# Patient Record
Sex: Male | Born: 1935 | Race: Black or African American | Hispanic: No | Marital: Married | Smoking: Never smoker
Health system: Southern US, Community
[De-identification: ages and names within clinical notes are randomized; demographics above are authoritative.]

## PROBLEM LIST (undated history)

## (undated) DIAGNOSIS — I700 Atherosclerosis of aorta: Secondary | ICD-10-CM

## (undated) DIAGNOSIS — I10 Essential (primary) hypertension: Secondary | ICD-10-CM

## (undated) DIAGNOSIS — Z9842 Cataract extraction status, left eye: Secondary | ICD-10-CM

## (undated) DIAGNOSIS — Z9841 Cataract extraction status, right eye: Secondary | ICD-10-CM

## (undated) DIAGNOSIS — I429 Cardiomyopathy, unspecified: Secondary | ICD-10-CM

## (undated) DIAGNOSIS — J45909 Unspecified asthma, uncomplicated: Secondary | ICD-10-CM

## (undated) DIAGNOSIS — J449 Chronic obstructive pulmonary disease, unspecified: Secondary | ICD-10-CM

## (undated) DIAGNOSIS — I5022 Chronic systolic (congestive) heart failure: Secondary | ICD-10-CM

## (undated) DIAGNOSIS — E782 Mixed hyperlipidemia: Secondary | ICD-10-CM

## (undated) DIAGNOSIS — Z860101 Personal history of adenomatous and serrated colon polyps: Secondary | ICD-10-CM

## (undated) DIAGNOSIS — I442 Atrioventricular block, complete: Secondary | ICD-10-CM

## (undated) DIAGNOSIS — Z950 Presence of cardiac pacemaker: Secondary | ICD-10-CM

## (undated) DIAGNOSIS — R972 Elevated prostate specific antigen [PSA]: Secondary | ICD-10-CM

## (undated) DIAGNOSIS — I509 Heart failure, unspecified: Secondary | ICD-10-CM

## (undated) DIAGNOSIS — I428 Other cardiomyopathies: Secondary | ICD-10-CM

## (undated) DIAGNOSIS — J471 Bronchiectasis with (acute) exacerbation: Secondary | ICD-10-CM

## (undated) DIAGNOSIS — M1990 Unspecified osteoarthritis, unspecified site: Secondary | ICD-10-CM

## (undated) DIAGNOSIS — J850 Gangrene and necrosis of lung: Secondary | ICD-10-CM

## (undated) DIAGNOSIS — Z9109 Other allergy status, other than to drugs and biological substances: Secondary | ICD-10-CM

## (undated) DIAGNOSIS — Z86010 Personal history of colonic polyps: Secondary | ICD-10-CM

## (undated) DIAGNOSIS — D649 Anemia, unspecified: Secondary | ICD-10-CM

## (undated) HISTORY — PX: COLONOSCOPY: SHX174

---

## 2005-09-22 ENCOUNTER — Ambulatory Visit: Payer: Self-pay | Admitting: Internal Medicine

## 2005-12-19 HISTORY — PX: COLONOSCOPY: SHX174

## 2006-04-11 ENCOUNTER — Ambulatory Visit: Payer: Self-pay | Admitting: Unknown Physician Specialty

## 2007-12-20 DIAGNOSIS — Z950 Presence of cardiac pacemaker: Secondary | ICD-10-CM

## 2007-12-20 HISTORY — DX: Presence of cardiac pacemaker: Z95.0

## 2008-09-26 ENCOUNTER — Ambulatory Visit: Payer: Self-pay | Admitting: Cardiology

## 2008-09-27 ENCOUNTER — Other Ambulatory Visit: Payer: Self-pay

## 2008-09-29 HISTORY — PX: PACEMAKER INSERTION: SHX728

## 2008-09-29 HISTORY — PX: INSERT / REPLACE / REMOVE PACEMAKER: SUR710

## 2009-06-15 ENCOUNTER — Ambulatory Visit: Payer: Self-pay | Admitting: Internal Medicine

## 2010-12-19 HISTORY — PX: COLONOSCOPY: SHX174

## 2011-07-06 ENCOUNTER — Ambulatory Visit: Payer: Self-pay | Admitting: Unknown Physician Specialty

## 2012-03-04 ENCOUNTER — Emergency Department: Payer: Self-pay | Admitting: Internal Medicine

## 2012-03-04 LAB — CBC
MCH: 27.9 pg (ref 26.0–34.0)
MCV: 86 fL (ref 80–100)
Platelet: 231 10*3/uL (ref 150–440)
RDW: 15.1 % — ABNORMAL HIGH (ref 11.5–14.5)

## 2012-03-04 LAB — COMPREHENSIVE METABOLIC PANEL
Anion Gap: 11 (ref 7–16)
Bilirubin,Total: 0.6 mg/dL (ref 0.2–1.0)
Calcium, Total: 8.8 mg/dL (ref 8.5–10.1)
Chloride: 102 mmol/L (ref 98–107)
Co2: 26 mmol/L (ref 21–32)
Creatinine: 1.49 mg/dL — ABNORMAL HIGH (ref 0.60–1.30)
EGFR (African American): 60 — ABNORMAL LOW
EGFR (Non-African Amer.): 49 — ABNORMAL LOW
Osmolality: 280 (ref 275–301)
Potassium: 4 mmol/L (ref 3.5–5.1)
SGOT(AST): 26 U/L (ref 15–37)

## 2012-03-04 LAB — CK TOTAL AND CKMB (NOT AT ARMC): CK-MB: <0.5 ng/mL — ABNORMAL LOW (ref 0.5–3.6)

## 2012-03-04 LAB — TROPONIN I: Troponin-I: <0.02 ng/mL

## 2012-06-01 ENCOUNTER — Emergency Department: Payer: Self-pay | Admitting: Emergency Medicine

## 2014-04-29 ENCOUNTER — Ambulatory Visit: Payer: Self-pay | Admitting: Internal Medicine

## 2014-10-21 DIAGNOSIS — I5022 Chronic systolic (congestive) heart failure: Secondary | ICD-10-CM | POA: Insufficient documentation

## 2015-10-05 DIAGNOSIS — I10 Essential (primary) hypertension: Secondary | ICD-10-CM | POA: Insufficient documentation

## 2015-10-05 DIAGNOSIS — Z950 Presence of cardiac pacemaker: Secondary | ICD-10-CM | POA: Insufficient documentation

## 2015-12-03 ENCOUNTER — Encounter: Payer: Self-pay | Admitting: *Deleted

## 2015-12-08 NOTE — Discharge Instructions (Signed)

## 2015-12-09 ENCOUNTER — Encounter
Admission: RE | Disposition: A | Discharge status: Home / Self Care | Payer: Self-pay | Source: Ambulatory Visit | Attending: Ophthalmology

## 2015-12-09 ENCOUNTER — Ambulatory Visit: Payer: Medicare Other | Admitting: Anesthesiology

## 2015-12-09 ENCOUNTER — Ambulatory Visit
Admission: RE | Admit: 2015-12-09 | Discharge: 2015-12-09 | Disposition: A | Discharge status: Home / Self Care | Payer: Medicare Other | Source: Ambulatory Visit | Attending: Ophthalmology | Admitting: Ophthalmology

## 2015-12-09 DIAGNOSIS — J439 Emphysema, unspecified: Secondary | ICD-10-CM | POA: Diagnosis not present

## 2015-12-09 DIAGNOSIS — Z950 Presence of cardiac pacemaker: Secondary | ICD-10-CM | POA: Diagnosis not present

## 2015-12-09 DIAGNOSIS — I509 Heart failure, unspecified: Secondary | ICD-10-CM | POA: Insufficient documentation

## 2015-12-09 DIAGNOSIS — I10 Essential (primary) hypertension: Secondary | ICD-10-CM | POA: Insufficient documentation

## 2015-12-09 DIAGNOSIS — H2511 Age-related nuclear cataract, right eye: Secondary | ICD-10-CM | POA: Insufficient documentation

## 2015-12-09 DIAGNOSIS — M1990 Unspecified osteoarthritis, unspecified site: Secondary | ICD-10-CM | POA: Diagnosis not present

## 2015-12-09 HISTORY — DX: Essential (primary) hypertension: I10

## 2015-12-09 HISTORY — DX: Unspecified asthma, uncomplicated: J45.909

## 2015-12-09 HISTORY — DX: Chronic obstructive pulmonary disease, unspecified: J44.9

## 2015-12-09 HISTORY — DX: Presence of cardiac pacemaker: Z95.0

## 2015-12-09 HISTORY — DX: Unspecified osteoarthritis, unspecified site: M19.90

## 2015-12-09 HISTORY — DX: Cardiomyopathy, unspecified: I42.9

## 2015-12-09 HISTORY — DX: Heart failure, unspecified: I50.9

## 2015-12-09 HISTORY — DX: Other allergy status, other than to drugs and biological substances: Z91.09

## 2015-12-09 HISTORY — PX: CATARACT EXTRACTION W/PHACO: SHX586

## 2015-12-09 SURGERY — PHACOEMULSIFICATION, CATARACT, WITH IOL INSERTION
Anesthesia: Monitor Anesthesia Care | Laterality: Right | Wound class: Clean

## 2015-12-09 MED ORDER — ACETAMINOPHEN 160 MG/5ML PO SOLN: 325.0000 mg | ORAL | Status: DC | PRN

## 2015-12-09 MED ORDER — NA HYALUR & NA CHOND-NA HYALUR 0.4-0.35 ML IO KIT
PACK | INTRAOCULAR | Status: DC | PRN
  Administered 2015-12-09: 1 mL via INTRAOCULAR

## 2015-12-09 MED ORDER — BRIMONIDINE TARTRATE 0.2 % OP SOLN
OPHTHALMIC | Status: DC | PRN
  Administered 2015-12-09: 1 [drp] via OPHTHALMIC

## 2015-12-09 MED ORDER — TIMOLOL MALEATE 0.5 % OP SOLN
OPHTHALMIC | Status: DC | PRN
  Administered 2015-12-09: 1 [drp] via OPHTHALMIC

## 2015-12-09 MED ORDER — ACETAMINOPHEN 325 MG PO TABS: 325.0000 mg | ORAL_TABLET | ORAL | Status: DC | PRN

## 2015-12-09 MED ORDER — CEFUROXIME OPHTHALMIC INJECTION 1 MG/0.1 ML
INJECTION | OPHTHALMIC | Status: DC | PRN
  Administered 2015-12-09: 0.1 mL via INTRACAMERAL

## 2015-12-09 MED ORDER — POVIDONE-IODINE 5 % OP SOLN
1.0000 "application " | OPHTHALMIC | Status: DC | PRN
  Administered 2015-12-09: 1 via OPHTHALMIC

## 2015-12-09 MED ORDER — TETRACAINE HCL 0.5 % OP SOLN
1.0000 [drp] | OPHTHALMIC | Status: DC | PRN
  Administered 2015-12-09: 1 [drp] via OPHTHALMIC

## 2015-12-09 MED ORDER — OXYCODONE HCL 5 MG PO TABS: 5.0000 mg | ORAL_TABLET | Freq: Once | ORAL | Status: DC | PRN

## 2015-12-09 MED ORDER — OXYCODONE HCL 5 MG/5ML PO SOLN: 5.0000 mg | Freq: Once | ORAL | Status: DC | PRN

## 2015-12-09 MED ORDER — DEXAMETHASONE SODIUM PHOSPHATE 4 MG/ML IJ SOLN: 8.0000 mg | Freq: Once | INTRAMUSCULAR | Status: DC | PRN

## 2015-12-09 MED ORDER — LACTATED RINGERS IV SOLN: 500.0000 mL | INTRAVENOUS | Status: DC

## 2015-12-09 MED ORDER — FENTANYL CITRATE (PF) 100 MCG/2ML IJ SOLN
INTRAMUSCULAR | Status: DC | PRN
  Administered 2015-12-09: 50 ug via INTRAVENOUS

## 2015-12-09 MED ORDER — ARMC OPHTHALMIC DILATING GEL
1.0000 "application " | OPHTHALMIC | Status: DC | PRN
  Administered 2015-12-09 (×2): 1 via OPHTHALMIC

## 2015-12-09 MED ORDER — MIDAZOLAM HCL 2 MG/2ML IJ SOLN
INTRAMUSCULAR | Status: DC | PRN
  Administered 2015-12-09: 2 mg via INTRAVENOUS

## 2015-12-09 MED ORDER — EPINEPHRINE HCL 1 MG/ML IJ SOLN
INTRAOCULAR | Status: DC | PRN
  Administered 2015-12-09: 70 mL via OPHTHALMIC

## 2015-12-09 MED ORDER — FENTANYL CITRATE (PF) 100 MCG/2ML IJ SOLN: 25.0000 ug | INTRAMUSCULAR | Status: DC | PRN

## 2015-12-09 MED ORDER — LACTATED RINGERS IV SOLN: INTRAVENOUS | Status: DC

## 2015-12-09 SURGICAL SUPPLY — 28 items
CANNULA ANT/CHMB 27GA (MISCELLANEOUS) ×3 IMPLANT
CARTRIDGE ABBOTT (MISCELLANEOUS) ×3 IMPLANT
GLOVE SURG LX 7.5 STRW (GLOVE) ×2
GLOVE SURG LX STRL 7.5 STRW (GLOVE) ×1 IMPLANT
GLOVE SURG TRIUMPH 8.0 PF LTX (GLOVE) ×3 IMPLANT
GOWN STRL REUS W/ TWL LRG LVL3 (GOWN DISPOSABLE) ×2 IMPLANT
GOWN STRL REUS W/TWL LRG LVL3 (GOWN DISPOSABLE) ×4
LENS IOL TECNIS 21 (Intraocular Lens) ×1 IMPLANT
LENS IOL TECNIS 21.0 (Intraocular Lens) ×2 IMPLANT
LENS IOL TECNIS MONO 1P 21.0 (Intraocular Lens) ×1 IMPLANT
MARKER SKIN SURG W/RULER VIO (MISCELLANEOUS) ×3 IMPLANT
NDL RETROBULBAR .5 NSTRL (NEEDLE) IMPLANT
NEEDLE FILTER BLUNT 18X 1/2SAF (NEEDLE) ×2
NEEDLE FILTER BLUNT 18X1 1/2 (NEEDLE) ×1 IMPLANT
PACK CATARACT BRASINGTON (MISCELLANEOUS) ×3 IMPLANT
PACK EYE AFTER SURG (MISCELLANEOUS) ×3 IMPLANT
PACK OPTHALMIC (MISCELLANEOUS) ×3 IMPLANT
RING MALYGIN 7.0 (MISCELLANEOUS) IMPLANT
SUT ETHILON 10-0 CS-B-6CS-B-6 (SUTURE)
SUT VICRYL  9 0 (SUTURE)
SUT VICRYL 9 0 (SUTURE) IMPLANT
SUTURE EHLN 10-0 CS-B-6CS-B-6 (SUTURE) IMPLANT
SYR 3ML LL SCALE MARK (SYRINGE) ×3 IMPLANT
SYR 5ML LL (SYRINGE) IMPLANT
SYR TB 1ML LUER SLIP (SYRINGE) ×3 IMPLANT
WATER STERILE IRR 250ML POUR (IV SOLUTION) ×3 IMPLANT
WATER STERILE IRR 500ML POUR (IV SOLUTION) IMPLANT
WIPE NON LINTING 3.25X3.25 (MISCELLANEOUS) ×3 IMPLANT

## 2015-12-09 NOTE — Anesthesia Postprocedure Evaluation (Signed)
Anesthesia Post Note  Patient: Patrick Figueroa.  Procedure(s) Performed: Procedure(s) (LRB): CATARACT EXTRACTION PHACO AND INTRAOCULAR LENS PLACEMENT (IOC) (Right)  Patient location during evaluation: PACU Anesthesia Type: MAC Level of consciousness: awake and alert Pain management: pain level controlled Vital Signs Assessment: post-procedure vital signs reviewed and stable Respiratory status: spontaneous breathing, nonlabored ventilation, respiratory function stable and patient connected to nasal cannula oxygen Cardiovascular status: blood pressure returned to baseline and stable Postop Assessment: no signs of nausea or vomiting Anesthetic complications: no    DANIEL D KOVACS

## 2015-12-09 NOTE — Anesthesia Procedure Notes (Signed)
Procedure Name: MAC Performed by: Ndidi Nesby Pre-anesthesia Checklist: Patient identified, Emergency Drugs available, Suction available, Timeout performed and Patient being monitored Patient Re-evaluated:Patient Re-evaluated prior to inductionOxygen Delivery Method: Nasal cannula Placement Confirmation: positive ETCO2     

## 2015-12-09 NOTE — Anesthesia Preprocedure Evaluation (Signed)
Anesthesia Evaluation  Patient identified by MRN, date of birth, ID band Patient awake    Reviewed: Allergy & Precautions, H&P , NPO status , Patient's Chart, lab work & pertinent test results, reviewed documented beta blocker date and time   Airway Mallampati: III  TM Distance: >3 FB Neck ROM: full    Dental no notable dental hx.    Pulmonary asthma , COPD,  COPD inhaler,    Pulmonary exam normal breath sounds clear to auscultation       Cardiovascular Exercise Tolerance: Good hypertension, On Medications +CHF  + pacemaker  Rhythm:regular Rate:Normal     Neuro/Psych negative neurological ROS  negative psych ROS   GI/Hepatic negative GI ROS, Neg liver ROS,   Endo/Other  negative endocrine ROS  Renal/GU negative Renal ROS  negative genitourinary   Musculoskeletal   Abdominal   Peds  Hematology negative hematology ROS (+)   Anesthesia Other Findings   Reproductive/Obstetrics negative OB ROS                             Anesthesia Physical Anesthesia Plan  ASA: IV  Anesthesia Plan: MAC   Post-op Pain Management:    Induction:   Airway Management Planned:   Additional Equipment:   Intra-op Plan:   Post-operative Plan:   Informed Consent: I have reviewed the patients History and Physical, chart, labs and discussed the procedure including the risks, benefits and alternatives for the proposed anesthesia with the patient or authorized representative who has indicated his/her understanding and acceptance.     Plan Discussed with: CRNA  Anesthesia Plan Comments:         Anesthesia Quick Evaluation

## 2015-12-09 NOTE — Transfer of Care (Signed)
Immediate Anesthesia Transfer of Care Note  Patient: Patrick Figueroa.  Procedure(s) Performed: Procedure(s): CATARACT EXTRACTION PHACO AND INTRAOCULAR LENS PLACEMENT (IOC) (Right)  Patient Location: PACU  Anesthesia Type: MAC  Level of Consciousness: awake, alert  and patient cooperative  Airway and Oxygen Therapy: Patient Spontanous Breathing and Patient connected to supplemental oxygen  Post-op Assessment: Post-op Vital signs reviewed, Patient's Cardiovascular Status Stable, Respiratory Function Stable, Patent Airway and No signs of Nausea or vomiting  Post-op Vital Signs: Reviewed and stable  Complications: No apparent anesthesia complications

## 2015-12-09 NOTE — H&P (Signed)
  The History and Physical notes are on paper, have been signed, and are to be scanned. The patient remains stable and unchanged from the H&P.   Previous H&P reviewed, patient examined, and there are no changes.  Elene Downum 12/09/2015 9:51 AM

## 2015-12-09 NOTE — Op Note (Signed)
LOCATION:  [facility]   PREOPERATIVE DIAGNOSIS:    Nuclear sclerotic cataract right eye. H25.11   POSTOPERATIVE DIAGNOSIS:  Nuclear sclerotic cataract right eye.     PROCEDURE:  Phacoemusification with posterior chamber intraocular lens placement of the right eye   LENS:   Implant Name Type Inv. Item Serial No. Manufacturer Lot No. LRB No. Used  LENS IMPL INTRAOC ZCB00 21.0 - [id number] Intraocular Lens LENS IMPL INTRAOC ZCB00 21.0 [id number] AMO   Right 1        ULTRASOUND TIME: 14 % of 1 minutes, 25 seconds.  CDE 12.1   SURGEON:  Deirdre Evener, MD   ANESTHESIA:  Topical with tetracaine drops and 2% Xylocaine jelly.   COMPLICATIONS:  None.   DESCRIPTION OF PROCEDURE:  The patient was identified in the holding room and transported to the operating room and placed in the supine position under the operating microscope.  The right eye was identified as the operative eye and it was prepped and draped in the usual sterile ophthalmic fashion.   A 1 millimeter clear-corneal paracentesis was made at the 12:00 position.  The anterior chamber was filled with Viscoat viscoelastic.  A 2.4 millimeter keratome was used to make a near-clear corneal incision at the 9:00 position.  A curvilinear capsulorrhexis was made with a cystotome and capsulorrhexis forceps.  Balanced salt solution was used to hydrodissect and hydrodelineate the nucleus.   Phacoemulsification was then used in stop and chop fashion to remove the lens nucleus and epinucleus.  The remaining cortex was then removed using the irrigation and aspiration handpiece. Provisc was then placed into the capsular bag to distend it for lens placement.  A lens was then injected into the capsular bag.  The remaining viscoelastic was aspirated.   Wounds were hydrated with balanced salt solution.  The anterior chamber was inflated to a physiologic pressure with balanced salt solution.  No wound leaks were noted. Cefuroxime 0.1 ml of  a 10mg /ml solution was injected into the anterior chamber for a dose of 1 mg of intracameral antibiotic at the completion of the case.   Timolol and Brimonidine drops were applied to the eye.  The patient was taken to the recovery room in stable condition without complications of anesthesia or surgery.   Tykera Skates 12/09/2015, 11:30 AM

## 2015-12-10 ENCOUNTER — Encounter: Payer: Self-pay | Admitting: Ophthalmology

## 2016-01-06 ENCOUNTER — Encounter: Payer: Self-pay | Admitting: *Deleted

## 2016-01-12 NOTE — Discharge Instructions (Signed)

## 2016-01-13 ENCOUNTER — Ambulatory Visit: Payer: Medicare Other | Admitting: Anesthesiology

## 2016-01-13 ENCOUNTER — Encounter: Payer: Self-pay | Admitting: *Deleted

## 2016-01-13 ENCOUNTER — Ambulatory Visit
Admission: RE | Admit: 2016-01-13 | Discharge: 2016-01-13 | Disposition: A | Discharge status: Home / Self Care | Payer: Medicare Other | Source: Ambulatory Visit | Attending: Ophthalmology | Admitting: Ophthalmology

## 2016-01-13 ENCOUNTER — Encounter
Admission: RE | Disposition: A | Discharge status: Home / Self Care | Payer: Self-pay | Source: Ambulatory Visit | Attending: Ophthalmology

## 2016-01-13 DIAGNOSIS — H2512 Age-related nuclear cataract, left eye: Secondary | ICD-10-CM | POA: Insufficient documentation

## 2016-01-13 DIAGNOSIS — I10 Essential (primary) hypertension: Secondary | ICD-10-CM | POA: Diagnosis not present

## 2016-01-13 DIAGNOSIS — J449 Chronic obstructive pulmonary disease, unspecified: Secondary | ICD-10-CM | POA: Insufficient documentation

## 2016-01-13 DIAGNOSIS — I509 Heart failure, unspecified: Secondary | ICD-10-CM | POA: Insufficient documentation

## 2016-01-13 DIAGNOSIS — Z950 Presence of cardiac pacemaker: Secondary | ICD-10-CM | POA: Insufficient documentation

## 2016-01-13 HISTORY — PX: CATARACT EXTRACTION W/PHACO: SHX586

## 2016-01-13 SURGERY — PHACOEMULSIFICATION, CATARACT, WITH IOL INSERTION
Anesthesia: Monitor Anesthesia Care | Laterality: Left | Wound class: Clean

## 2016-01-13 MED ORDER — BRIMONIDINE TARTRATE 0.2 % OP SOLN
OPHTHALMIC | Status: DC | PRN
  Administered 2016-01-13: 1 [drp] via OPHTHALMIC

## 2016-01-13 MED ORDER — FENTANYL CITRATE (PF) 100 MCG/2ML IJ SOLN
INTRAMUSCULAR | Status: DC | PRN
Start: 2016-01-13 — End: 2016-01-13
  Administered 2016-01-13: 50 ug via INTRAVENOUS

## 2016-01-13 MED ORDER — ACETAMINOPHEN 160 MG/5ML PO SOLN: 325.0000 mg | ORAL | Status: DC | PRN

## 2016-01-13 MED ORDER — CEFUROXIME OPHTHALMIC INJECTION 1 MG/0.1 ML
INJECTION | OPHTHALMIC | Status: DC | PRN
  Administered 2016-01-13: 0.1 mL via INTRACAMERAL

## 2016-01-13 MED ORDER — EPINEPHRINE HCL 1 MG/ML IJ SOLN
INTRAMUSCULAR | Status: DC | PRN
  Administered 2016-01-13: 70 mL via OPHTHALMIC

## 2016-01-13 MED ORDER — FENTANYL CITRATE (PF) 100 MCG/2ML IJ SOLN: 25.0000 ug | INTRAMUSCULAR | Status: DC | PRN

## 2016-01-13 MED ORDER — ACETAMINOPHEN 325 MG PO TABS: 325.0000 mg | ORAL_TABLET | ORAL | Status: DC | PRN

## 2016-01-13 MED ORDER — ARMC OPHTHALMIC DILATING GEL
1.0000 "application " | OPHTHALMIC | Status: DC | PRN
  Administered 2016-01-13 (×2): 1 via OPHTHALMIC

## 2016-01-13 MED ORDER — MIDAZOLAM HCL 2 MG/2ML IJ SOLN
INTRAMUSCULAR | Status: DC | PRN
  Administered 2016-01-13: 1 mg via INTRAVENOUS

## 2016-01-13 MED ORDER — OXYCODONE HCL 5 MG PO TABS: 5.0000 mg | ORAL_TABLET | Freq: Once | ORAL | Status: DC | PRN

## 2016-01-13 MED ORDER — TETRACAINE HCL 0.5 % OP SOLN
1.0000 [drp] | OPHTHALMIC | Status: DC | PRN
  Administered 2016-01-13: 1 [drp] via OPHTHALMIC

## 2016-01-13 MED ORDER — TIMOLOL MALEATE 0.5 % OP SOLN
OPHTHALMIC | Status: DC | PRN
Start: 2016-01-13 — End: 2016-01-13
  Administered 2016-01-13: 1 [drp] via OPHTHALMIC

## 2016-01-13 MED ORDER — NA HYALUR & NA CHOND-NA HYALUR 0.4-0.35 ML IO KIT
PACK | INTRAOCULAR | Status: DC | PRN
  Administered 2016-01-13: 1 mL via INTRAOCULAR

## 2016-01-13 MED ORDER — LACTATED RINGERS IV SOLN: 500.0000 mL | INTRAVENOUS | Status: DC

## 2016-01-13 MED ORDER — POVIDONE-IODINE 5 % OP SOLN
1.0000 "application " | OPHTHALMIC | Status: DC | PRN
  Administered 2016-01-13: 1 via OPHTHALMIC

## 2016-01-13 MED ORDER — OXYCODONE HCL 5 MG/5ML PO SOLN
5.0000 mg | Freq: Once | ORAL | Status: DC | PRN
Start: 2016-01-13 — End: 2016-01-13

## 2016-01-13 MED ORDER — LACTATED RINGERS IV SOLN: INTRAVENOUS | Status: DC

## 2016-01-13 MED ORDER — DEXAMETHASONE SODIUM PHOSPHATE 4 MG/ML IJ SOLN: 8.0000 mg | Freq: Once | INTRAMUSCULAR | Status: DC | PRN

## 2016-01-13 SURGICAL SUPPLY — 27 items
CANNULA ANT/CHMB 27GA (MISCELLANEOUS) ×3 IMPLANT
CARTRIDGE ABBOTT (MISCELLANEOUS) ×3 IMPLANT
GLOVE SURG LX 7.5 STRW (GLOVE) ×2
GLOVE SURG LX STRL 7.5 STRW (GLOVE) ×1 IMPLANT
GLOVE SURG TRIUMPH 8.0 PF LTX (GLOVE) ×3 IMPLANT
GOWN STRL REUS W/ TWL LRG LVL3 (GOWN DISPOSABLE) ×2 IMPLANT
GOWN STRL REUS W/TWL LRG LVL3 (GOWN DISPOSABLE) ×4
LENS IOL TECNIS 20.5 (Intraocular Lens) ×3 IMPLANT
LENS IOL TECNIS MONO 1P 20.5 (Intraocular Lens) ×1 IMPLANT
MARKER SKIN SURG W/RULER VIO (MISCELLANEOUS) ×3 IMPLANT
NDL RETROBULBAR .5 NSTRL (NEEDLE) IMPLANT
NEEDLE FILTER BLUNT 18X 1/2SAF (NEEDLE) ×2
NEEDLE FILTER BLUNT 18X1 1/2 (NEEDLE) ×1 IMPLANT
PACK CATARACT BRASINGTON (MISCELLANEOUS) ×3 IMPLANT
PACK EYE AFTER SURG (MISCELLANEOUS) ×3 IMPLANT
PACK OPTHALMIC (MISCELLANEOUS) ×3 IMPLANT
RING MALYGIN 7.0 (MISCELLANEOUS) IMPLANT
SUT ETHILON 10-0 CS-B-6CS-B-6 (SUTURE)
SUT VICRYL  9 0 (SUTURE)
SUT VICRYL 9 0 (SUTURE) IMPLANT
SUTURE EHLN 10-0 CS-B-6CS-B-6 (SUTURE) IMPLANT
SYR 3ML LL SCALE MARK (SYRINGE) ×3 IMPLANT
SYR 5ML LL (SYRINGE) IMPLANT
SYR TB 1ML LUER SLIP (SYRINGE) ×3 IMPLANT
WATER STERILE IRR 250ML POUR (IV SOLUTION) ×3 IMPLANT
WATER STERILE IRR 500ML POUR (IV SOLUTION) IMPLANT
WIPE NON LINTING 3.25X3.25 (MISCELLANEOUS) ×3 IMPLANT

## 2016-01-13 NOTE — Anesthesia Preprocedure Evaluation (Signed)
Anesthesia Evaluation  Patient identified by MRN, date of birth, ID band Patient awake    Reviewed: Allergy & Precautions, H&P , NPO status , Patient's Chart, lab work & pertinent test results, reviewed documented beta blocker date and time   Airway Mallampati: III  TM Distance: >3 FB Neck ROM: full    Dental no notable dental hx.    Pulmonary asthma , COPD,  COPD inhaler,    Pulmonary exam normal breath sounds clear to auscultation       Cardiovascular Exercise Tolerance: Good hypertension, On Medications +CHF  + pacemaker  Rhythm:regular Rate:Normal     Neuro/Psych negative neurological ROS  negative psych ROS   GI/Hepatic negative GI ROS, Neg liver ROS,   Endo/Other  negative endocrine ROS  Renal/GU negative Renal ROS  negative genitourinary   Musculoskeletal   Abdominal   Peds  Hematology negative hematology ROS (+)   Anesthesia Other Findings   Reproductive/Obstetrics negative OB ROS                             Anesthesia Physical Anesthesia Plan  ASA: IV  Anesthesia Plan: MAC   Post-op Pain Management:    Induction:   Airway Management Planned:   Additional Equipment:   Intra-op Plan:   Post-operative Plan:   Informed Consent: I have reviewed the patients History and Physical, chart, labs and discussed the procedure including the risks, benefits and alternatives for the proposed anesthesia with the patient or authorized representative who has indicated his/her understanding and acceptance.     Plan Discussed with: CRNA  Anesthesia Plan Comments:         Anesthesia Quick Evaluation  

## 2016-01-13 NOTE — Anesthesia Postprocedure Evaluation (Signed)
Anesthesia Post Note  Patient: Patrick Figueroa.  Procedure(s) Performed: Procedure(s) (LRB): CATARACT EXTRACTION PHACO AND INTRAOCULAR LENS PLACEMENT (IOC) (Left)  Patient location during evaluation: PACU Anesthesia Type: MAC Level of consciousness: awake and alert Pain management: pain level controlled Vital Signs Assessment: post-procedure vital signs reviewed and stable Respiratory status: spontaneous breathing, nonlabored ventilation and respiratory function stable Cardiovascular status: blood pressure returned to baseline and stable Postop Assessment: no signs of nausea or vomiting Anesthetic complications: no    DANIEL D KOVACS

## 2016-01-13 NOTE — H&P (Signed)
  The History and Physical notes are on paper, have been signed, and are to be scanned. The patient remains stable and unchanged from the H&P.   Previous H&P reviewed, patient examined, and there are no changes.  Patrick Figueroa 01/13/2016 9:20 AM

## 2016-01-13 NOTE — Anesthesia Procedure Notes (Signed)
Procedure Name: MAC Performed by: Meiya Wisler Pre-anesthesia Checklist: Patient identified, Emergency Drugs available, Suction available, Patient being monitored and Timeout performed Patient Re-evaluated:Patient Re-evaluated prior to inductionOxygen Delivery Method: Nasal cannula     

## 2016-01-13 NOTE — Op Note (Signed)
OPERATIVE NOTE  Patrick Figueroa [id number] 01/13/2016   PREOPERATIVE DIAGNOSIS:  Nuclear sclerotic cataract left eye. H25.12   POSTOPERATIVE DIAGNOSIS:    Nuclear sclerotic cataract left eye.     PROCEDURE:  Phacoemusification with posterior chamber intraocular lens placement of the left eye   LENS:   Implant Name Type Inv. Item Serial No. Manufacturer Lot No. LRB No. Used  LENS IMPL INTRAOC ZCB00 20.5 - [id number] Intraocular Lens LENS IMPL INTRAOC ZCB00 20.5 [id number] AMO   Left 1        ULTRASOUND TIME: 12  % of 0 minutes 46 seconds, CDE 5.65  SURGEON:  Deirdre Evener, MD   ANESTHESIA:  Topical with tetracaine drops and 2% Xylocaine jelly.   COMPLICATIONS:  None.   DESCRIPTION OF PROCEDURE:  The patient was identified in the holding room and transported to the operating room and placed in the supine position under the operating microscope.  The left eye was identified as the operative eye and it was prepped and draped in the usual sterile ophthalmic fashion.   A 1 millimeter clear-corneal paracentesis was made at the 1:30 position.  The anterior chamber was filled with Viscoat viscoelastic.  A 2.4 millimeter keratome was used to make a near-clear corneal incision at the 10:30 position.  .  A curvilinear capsulorrhexis was made with a cystotome and capsulorrhexis forceps.  Balanced salt solution was used to hydrodissect and hydrodelineate the nucleus.   Phacoemulsification was then used in stop and chop fashion to remove the lens nucleus and epinucleus.  The remaining cortex was then removed using the irrigation and aspiration handpiece. Provisc was then placed into the capsular bag to distend it for lens placement.  A lens was then injected into the capsular bag.  The remaining viscoelastic was aspirated.   Wounds were hydrated with balanced salt solution.  The anterior chamber was inflated to a physiologic pressure with balanced salt solution.  No wound leaks were noted.   Cefuroxime 0.1 ml of a 10mg /ml solution was injected into the anterior chamber for a dose of 1 mg of intracameral antibiotic at the completion of the case.   Timolol and Brimonidine drops were applied to the eye.  The patient was taken to the recovery room in stable condition without complications of anesthesia or surgery.  Patrick Figueroa 01/13/2016, 10:22 AM

## 2016-01-13 NOTE — Transfer of Care (Signed)
Immediate Anesthesia Transfer of Care Note  Patient: Patrick Figueroa.  Procedure(s) Performed: Procedure(s): CATARACT EXTRACTION PHACO AND INTRAOCULAR LENS PLACEMENT (IOC) (Left)  Patient Location: PACU  Anesthesia Type: MAC  Level of Consciousness: awake, alert  and patient cooperative  Airway and Oxygen Therapy: Patient Spontanous Breathing and Patient connected to supplemental oxygen  Post-op Assessment: Post-op Vital signs reviewed, Patient's Cardiovascular Status Stable, Respiratory Function Stable, Patent Airway and No signs of Nausea or vomiting  Post-op Vital Signs: Reviewed and stable  Complications: No apparent anesthesia complications

## 2016-01-14 ENCOUNTER — Encounter: Payer: Self-pay | Admitting: Ophthalmology

## 2016-06-24 DIAGNOSIS — Z86010 Personal history of colonic polyps: Secondary | ICD-10-CM | POA: Insufficient documentation

## 2016-06-24 DIAGNOSIS — Z860101 Personal history of adenomatous and serrated colon polyps: Secondary | ICD-10-CM | POA: Insufficient documentation

## 2017-04-13 DIAGNOSIS — R600 Localized edema: Secondary | ICD-10-CM | POA: Insufficient documentation

## 2017-07-07 ENCOUNTER — Other Ambulatory Visit: Payer: Self-pay | Admitting: Internal Medicine

## 2017-07-07 DIAGNOSIS — R05 Cough: Secondary | ICD-10-CM

## 2017-07-07 DIAGNOSIS — R053 Chronic cough: Secondary | ICD-10-CM

## 2017-07-17 ENCOUNTER — Ambulatory Visit
Admission: RE | Admit: 2017-07-17 | Discharge: 2017-07-17 | Disposition: A | Discharge status: Home / Self Care | Payer: Medicare Other | Source: Ambulatory Visit | Attending: Internal Medicine | Admitting: Internal Medicine

## 2017-07-17 DIAGNOSIS — J9811 Atelectasis: Secondary | ICD-10-CM | POA: Insufficient documentation

## 2017-07-17 DIAGNOSIS — R05 Cough: Secondary | ICD-10-CM | POA: Diagnosis not present

## 2017-07-17 DIAGNOSIS — R053 Chronic cough: Secondary | ICD-10-CM

## 2017-07-17 DIAGNOSIS — I700 Atherosclerosis of aorta: Secondary | ICD-10-CM | POA: Insufficient documentation

## 2017-07-25 ENCOUNTER — Ambulatory Visit (INDEPENDENT_AMBULATORY_CARE_PROVIDER_SITE_OTHER): Payer: Medicare Other | Admitting: Urology

## 2017-07-25 ENCOUNTER — Encounter: Payer: Self-pay | Admitting: Urology

## 2017-07-25 VITALS — BP 120/70 | HR 99 | Ht 69.0 in | Wt 240.0 lb

## 2017-07-25 DIAGNOSIS — J309 Allergic rhinitis, unspecified: Secondary | ICD-10-CM | POA: Insufficient documentation

## 2017-07-25 DIAGNOSIS — T7840XA Allergy, unspecified, initial encounter: Secondary | ICD-10-CM | POA: Insufficient documentation

## 2017-07-25 DIAGNOSIS — J45909 Unspecified asthma, uncomplicated: Secondary | ICD-10-CM | POA: Insufficient documentation

## 2017-07-25 DIAGNOSIS — R739 Hyperglycemia, unspecified: Secondary | ICD-10-CM | POA: Insufficient documentation

## 2017-07-25 DIAGNOSIS — I442 Atrioventricular block, complete: Secondary | ICD-10-CM | POA: Insufficient documentation

## 2017-07-25 DIAGNOSIS — E782 Mixed hyperlipidemia: Secondary | ICD-10-CM | POA: Insufficient documentation

## 2017-07-25 DIAGNOSIS — H1010 Acute atopic conjunctivitis, unspecified eye: Secondary | ICD-10-CM | POA: Insufficient documentation

## 2017-07-25 DIAGNOSIS — R972 Elevated prostate specific antigen [PSA]: Secondary | ICD-10-CM | POA: Diagnosis not present

## 2017-07-25 NOTE — Progress Notes (Signed)
07/25/2017 2:50 PM   Patrick Figueroa. 1936-07-23 [id number]  Referring provider: Marguarite Arbour, MD [address] [facility] [place], [place] [address]  Chief Complaint  Patient presents with  . Elevated PSA    New Patient    HPI: 81 year old male in who presents today for further evaluation of elevated PSA. He continues to have routine PSA surveillance despite his age. He denies having recent rectal exams.     No family history of prostate cancer.    He does void frequently due to lasix.  He has no other urinary complains.  No sensation of incomplete emptying.  No dysuria or gross hematuria. He has not bothered by his urinary symptoms.  PSA trend is below:  12/25/2014 4.01 07/08/2015 4.15 12/30/2015 4.66 06/28/2016 4.53 12/30/2016 4.75 06/29/2017 6.52  He does have multiple medical comorbidities including cardiomyopathy, CHF (EF 30%), and is status post pacemaker implant in 2019 for complete heart block.  He is on chronic aspirin.  PMH: Past Medical History:  Diagnosis Date  . Arthritis    knee  . Asthma   . Cardiomyopathy (HCC)   . CHF (congestive heart failure) (HCC)   . COPD (chronic obstructive pulmonary disease) (HCC)    emphysema  . Environmental allergies   . Hypertension   . Presence of permanent cardiac pacemaker     Surgical History: Past Surgical History:  Procedure Laterality Date  . CATARACT EXTRACTION W/PHACO Right 12/09/2015   Procedure: CATARACT EXTRACTION PHACO AND INTRAOCULAR LENS PLACEMENT (IOC);  Surgeon: Lockie Mola, MD;  [facility];  Service: Ophthalmology;  Laterality: Right;  . CATARACT EXTRACTION W/PHACO Left 01/13/2016   Procedure: CATARACT EXTRACTION PHACO AND INTRAOCULAR LENS PLACEMENT (IOC);  Surgeon: Lockie Mola, MD;  Location: [facility];  Service: Ophthalmology;  Laterality: Left;  . COLONOSCOPY    . INSERT / REPLACE / REMOVE PACEMAKER  09/29/08    Home Medications:    Allergies as of 07/25/2017   No Known Allergies     Medication List       Accurate as of 07/25/17  2:50 PM. Always use your most recent med list.          acetaminophen 500 MG tablet Commonly known as:  TYLENOL Take 500 mg by mouth every 6 (six) hours as needed.   amLODipine 10 MG tablet Commonly known as:  NORVASC Take 10 mg by mouth daily. AM   aspirin 81 MG tablet Take 81 mg by mouth daily. AM   azelastine 0.05 % ophthalmic solution Commonly known as:  OPTIVAR 1 drop 2 (two) times daily.   carvedilol 12.5 MG tablet Commonly known as:  COREG Take 12.5 mg by mouth 2 (two) times daily with a meal.   COMBIVENT RESPIMAT 20-100 MCG/ACT Aers respimat Generic drug:  Ipratropium-Albuterol Inhale 1 puff into the lungs every 6 (six) hours.   fluticasone 50 MCG/ACT nasal spray Commonly known as:  FLONASE Place into both nostrils daily. PM   Fluticasone-Salmeterol 250-50 MCG/DOSE Aepb Commonly known as:  ADVAIR Inhale 1 puff into the lungs 2 (two) times daily.   furosemide 40 MG tablet Commonly known as:  LASIX Take 40 mg by mouth every 8 (eight) hours.   guaiFENesin 600 MG 12 hr tablet Commonly known as:  MUCINEX Take 600 mg by mouth 2 (two) times daily as needed.   levocetirizine 5 MG tablet Commonly known as:  XYZAL Take 5 mg by mouth every evening.   losartan 50 MG tablet  Commonly known as:  COZAAR Take 50 mg by mouth daily. PM   meloxicam 15 MG tablet Commonly known as:  MOBIC Take 15 mg by mouth daily as needed for pain.   montelukast 10 MG tablet Commonly known as:  SINGULAIR Take 10 mg by mouth at bedtime.   potassium chloride 10 MEQ tablet Commonly known as:  K-DUR,KLOR-CON Take 10 mEq by mouth daily. Mid day       Allergies: No Known Allergies  Family History: No family history on file.  Social History:  reports that he has never smoked. He has never used smokeless tobacco. He reports that he does not drink alcohol. His drug history is not  on file.  ROS: UROLOGY Frequent Urination?: No Hard to postpone urination?: Yes Burning/pain with urination?: No Get up at night to urinate?: Yes Leakage of urine?: No Urine stream starts and stops?: No Trouble starting stream?: No Do you have to strain to urinate?: No Blood in urine?: No Urinary tract infection?: No Sexually transmitted disease?: No Injury to kidneys or bladder?: No Painful intercourse?: No Weak stream?: No Erection problems?: No Penile pain?: No  Gastrointestinal Nausea?: No Vomiting?: No Indigestion/heartburn?: No Diarrhea?: No Constipation?: No  Constitutional Fever: No Night sweats?: No Weight loss?: No Fatigue?: No  Skin Skin rash/lesions?: No Itching?: No  Eyes Blurred vision?: No Double vision?: No  Ears/Nose/Throat Sore throat?: No Sinus problems?: Yes  Hematologic/Lymphatic Swollen glands?: No Easy bruising?: No  Cardiovascular Leg swelling?: Yes Chest pain?: No  Respiratory Cough?: Yes Shortness of breath?: Yes  Endocrine Excessive thirst?: No  Musculoskeletal Back pain?: No Joint pain?: Yes  Neurological Headaches?: No Dizziness?: No  Psychologic Depression?: No Anxiety?: No  Physical Exam: BP 120/70   Pulse 99   Ht 5\' 9"  (1.753 m)   Wt 240 lb (108.9 kg)   BMI 35.44 kg/m   Constitutional:  Alert and oriented, No acute distress. HEENT: Vernal AT, moist mucus membranes.  Trachea midline, no masses. Cardiovascular: No clubbing, cyanosis, or edema. Respiratory: Normal respiratory effort, no increased work of breathing. GI: Abdomen is soft, nontender, nondistended, no abdominal masses.  Obese. Small reducible umbilical hernia. GU: No CVA tenderness.  Rectal: Normal sphincter tone. Enlarged 40 cc prostate, no obvious discrete nodules or tenderness.. Skin: No rashes, bruises or suspicious lesions. Neurologic: Grossly intact, no focal deficits, moving all 4 extremities. Psychiatric: Normal mood and  affect.  Laboratory Data: Lab Results  Component Value Date   WBC 6.4 03/04/2012   HGB 13.0 03/04/2012   HCT 39.9 (L) 03/04/2012   MCV 86 03/04/2012   PLT 231 03/04/2012    Lab Results  Component Value Date   CREATININE 1.49 (H) 03/04/2012   PSA trend as above  Urinalysis N/a  Pertinent Imaging: n/a  Assessment & Plan:  81 year old male with multiple medical comorbidities as above with slowly rising PSA.  1. Elevated PSA  We reviewed the implications of an elevated PSA and the uncertainty surrounding it. In general, a man's PSA increases with age and is produced by both normal and cancerous prostate tissue. Differential for elevated PSA is BPH, prostate cancer, infection, recent intercourse/ejaculation, prostate infarction, recent urethroscopic manipulation (foley placement/cystoscopy) and prostatitis. Management of an elevated PSA can include observation or prostate biopsy and we discussed this in detail.  We discussed that indications for prostate biopsy are defined by age and race specific PSA cutoffs as well as a PSA velocity of 0.75/year.  Given his age and comorbidities, I would recommend surveillance  at this time. Rectal exam was reassuring.  We discussed that in order to benefit from local therapy for prostate cancer, history of expectancy must be greater than 10 years.  If his PSA continues to trend upward to next visit, he will need cardiac clearance for prostate biopsy.  He is agreeable with this plan. He understands the importance of close follow-up.   Return in about 6 months (around 01/25/2018) for PSA/ DRE (recheck PSA 2 days prior to visit).  Vanna Scotland, MD  [facility] [address], [address] [place], [place] [address] [phone]

## 2017-08-09 NOTE — Progress Notes (Signed)
[facility] [place] [facility] Consultation      Assessment and Plan:  COPD/emphysema -Appears adequately controlled at this time, continue Advair. -Asked that he decrease Combivent from 4 times daily to twice daily, and continue to use as needed in between.  Pneumonia.  -CT chest showed bibasilar pneumonia in a bronchocentric pattern, occurred along with symptoms consistent with pneumonia. -Possibly remains of bibasilar atelectasis versus organizing pneumonia, will repeat CT chest in 4 months to ensure resolution. However, given his advanced comorbidities, he would likely be a poor candidate for any interventional procedures.  Dyspnea on exertion.  -Patient has moderate functional status, he continues to be fairly active despite his comorbidities and advanced age. I encouraged him to continue to participate in activities. -His dyspnea is likely multifactorial from cardiac, pulmonary diseases as well as deconditioning. -At subsequent visit. Could consider referral to cardiac or pulmonary rehabilitation.   Date: 08/09/2017  MRN# [id number] Turner Baillie. 1936/01/05    Manley Babak Lucus. is a 81 y.o. old male seen in consultation for chief complaint of: possible infection   Chief Complaint  Patient presents with  . pulmonary consult    per Dr. Judithann Sheen- recent CT 07/17/17.     HPI:   The patient is a 81 year old male with history of chronic systolic heart failure, status post pacemaker, dilated cardiomyopathy with ejection fraction of 25%. He notes that he had a pneumonia about a year ago and since that time he felt that he had something "nagging" since then. He continued to have cough and excess mucus production. He was then sent for a CT scan which showed persistent pneumonia. He was then given a script for levaquin for 10 days. He now feels that he is doing better.  He takes advair twice daily, he has combivent 4x daily as prescribed.  He is fairly active, he uses a Firefighter and  goes to grocery as long as he is pushing a cart. He does have trouble with some chores such as vacuuming. He has a special needs child that he takes to school and picks up everyday.   He is sleepy during the day, he has never been tested for sleep apnea. Their daughter has a diagnosis of OSA and is on CPAP.   **Desat walk 08/15/17; on rest on RA sat was 96% and HR 70. Walked 180 feet at moderate pace, sat was 95% ,HR 92 mild dyspnea.   Images personally reviewed; CT chest noncontrasted, 07/17/2017; Bibasilar bronchocentric infiltrates extending medially. Mild thoracic adenopathy   PMHX:   Past Medical History:  Diagnosis Date  . Arthritis    knee  . Asthma   . Cardiomyopathy (HCC)   . CHF (congestive heart failure) (HCC)   . COPD (chronic obstructive pulmonary disease) (HCC)    emphysema  . Environmental allergies   . Hypertension   . Presence of permanent cardiac pacemaker    Surgical Hx:  Past Surgical History:  Procedure Laterality Date  . CATARACT EXTRACTION W/PHACO Right 12/09/2015   Procedure: CATARACT EXTRACTION PHACO AND INTRAOCULAR LENS PLACEMENT (IOC);  Surgeon: Lockie Mola, MD;  Location: [facility];  Service: Ophthalmology;  Laterality: Right;  . CATARACT EXTRACTION W/PHACO Left 01/13/2016   Procedure: CATARACT EXTRACTION PHACO AND INTRAOCULAR LENS PLACEMENT (IOC);  Surgeon: Lockie Mola, MD;  Location: [facility];  Service: Ophthalmology;  Laterality: Left;  . COLONOSCOPY    . INSERT / REPLACE / REMOVE PACEMAKER  09/29/08   Family Hx:  No family history on file.  Social Hx:   Social History  Substance Use Topics  . Smoking status: Never Smoker  . Smokeless tobacco: Never Used  . Alcohol use No   Medication:    Current Outpatient Prescriptions:  .  acetaminophen (TYLENOL) 500 MG tablet, Take 500 mg by mouth every 6 (six) hours as needed., Disp: , Rfl:  .  amLODipine (NORVASC) 10 MG tablet, Take 10 mg by mouth daily. AM, Disp: ,  Rfl:  .  aspirin 81 MG tablet, Take 81 mg by mouth daily. AM, Disp: , Rfl:  .  azelastine (OPTIVAR) 0.05 % ophthalmic solution, 1 drop 2 (two) times daily., Disp: , Rfl:  .  carvedilol (COREG) 12.5 MG tablet, Take 12.5 mg by mouth 2 (two) times daily with a meal., Disp: , Rfl:  .  fluticasone (FLONASE) 50 MCG/ACT nasal spray, Place into both nostrils daily. PM, Disp: , Rfl:  .  Fluticasone-Salmeterol (ADVAIR) 250-50 MCG/DOSE AEPB, Inhale 1 puff into the lungs 2 (two) times daily., Disp: , Rfl:  .  furosemide (LASIX) 40 MG tablet, Take 40 mg by mouth every 8 (eight) hours., Disp: , Rfl:  .  guaiFENesin (MUCINEX) 600 MG 12 hr tablet, Take 600 mg by mouth 2 (two) times daily as needed., Disp: , Rfl:  .  Ipratropium-Albuterol (COMBIVENT RESPIMAT) 20-100 MCG/ACT AERS respimat, Inhale 1 puff into the lungs every 6 (six) hours., Disp: , Rfl:  .  levocetirizine (XYZAL) 5 MG tablet, Take 5 mg by mouth every evening., Disp: , Rfl:  .  losartan (COZAAR) 50 MG tablet, Take 50 mg by mouth daily. PM, Disp: , Rfl:  .  meloxicam (MOBIC) 15 MG tablet, Take 15 mg by mouth daily as needed for pain., Disp: , Rfl:  .  montelukast (SINGULAIR) 10 MG tablet, Take 10 mg by mouth at bedtime., Disp: , Rfl:  .  potassium chloride (K-DUR,KLOR-CON) 10 MEQ tablet, Take 10 mEq by mouth daily. Mid day, Disp: , Rfl:    Allergies:  Patient has no known allergies.  Review of Systems: Gen:  Denies  fever, sweats, chills HEENT: Denies blurred vision, double vision. bleeds, sore throat Cvc:  No dizziness, chest pain. Resp:   Denies cough or sputum production, shortness of breath Gi: Denies swallowing difficulty, stomach pain. Gu:  Denies bladder incontinence, burning urine Ext:   No Joint pain, stiffness. Skin: No skin rash,  hives  Endoc:  No polyuria, polydipsia. Psych: No depression, insomnia. Other:  All other systems were reviewed with the patient and were negative other that what is mentioned in the HPI.   Physical  Examination:   VS: BP 132/80 (BP Location: Right Arm, Cuff Size: Normal)   Pulse 91   Ht 5\' 9"  (1.753 m)   Wt 231 lb (104.8 kg)   SpO2 96%   BMI 34.11 kg/m   General Appearance: No distress  Neuro:without focal findings,  speech normal,  HEENT: PERRLA, EOM intact.   Pulmonary: normal breath sounds, No wheezing.  CardiovascularNormal S1,S2.  No m/r/g.   Abdomen: Benign, Soft, non-tender. Renal:  No costovertebral tenderness  GU:  No performed at this time. Endoc: No evident thyromegaly, no signs of acromegaly. Skin:   warm, no rashes, no ecchymosis  Extremities: normal, no cyanosis, clubbing.  Other findings:    LABORATORY PANEL:   CBC No results for input(s): WBC, HGB, HCT, PLT in the last 168 hours. ------------------------------------------------------------------------------------------------------------------  Chemistries  No results for input(s): NA, K, CL, CO2, GLUCOSE, BUN, CREATININE, CALCIUM, MG, AST, ALT,  ALKPHOS, BILITOT in the last 168 hours.  Invalid input(s): GFRCGP ------------------------------------------------------------------------------------------------------------------  Cardiac Enzymes No results for input(s): TROPONINI in the last 168 hours. ------------------------------------------------------------  RADIOLOGY:  No results found.     Thank  you for the consultation and for allowing [facility] [place] [facility] to assist in the care of your patient. Our recommendations are noted above.  Please contact us if we can be of further service.   Wells Guiles, MD.  Board Certified in Internal Medicine, Pulmonary Medicine, Critical Care Medicine, and Sleep Medicine.  [place] [facility] Office Number: [phone]  Santiago Glad, M.D.  Billy Fischer, M.D  08/09/2017

## 2017-08-15 ENCOUNTER — Ambulatory Visit (INDEPENDENT_AMBULATORY_CARE_PROVIDER_SITE_OTHER): Payer: Medicare Other | Admitting: Internal Medicine

## 2017-08-15 ENCOUNTER — Encounter: Payer: Self-pay | Admitting: Internal Medicine

## 2017-08-15 VITALS — BP 132/80 | HR 91 | Ht 69.0 in | Wt 231.0 lb

## 2017-08-15 DIAGNOSIS — J181 Lobar pneumonia, unspecified organism: Secondary | ICD-10-CM | POA: Diagnosis not present

## 2017-08-15 DIAGNOSIS — J449 Chronic obstructive pulmonary disease, unspecified: Secondary | ICD-10-CM

## 2017-08-15 NOTE — Patient Instructions (Addendum)
Continue advair. Decrease combivent to twice daily, and can use it more often if needed.   --Ct chest in about 4 months, and follow up.

## 2017-09-01 ENCOUNTER — Institutional Professional Consult (permissible substitution): Payer: Medicare Other | Admitting: Pulmonary Disease

## 2017-12-19 DIAGNOSIS — Z95810 Presence of automatic (implantable) cardiac defibrillator: Secondary | ICD-10-CM

## 2017-12-19 HISTORY — DX: Presence of automatic (implantable) cardiac defibrillator: Z95.810

## 2017-12-26 ENCOUNTER — Ambulatory Visit: Payer: Medicare Other

## 2017-12-26 ENCOUNTER — Ambulatory Visit
Admission: RE | Admit: 2017-12-26 | Discharge: 2017-12-26 | Disposition: A | Discharge status: Home / Self Care | Payer: Medicare Other | Source: Ambulatory Visit | Attending: Internal Medicine | Admitting: Internal Medicine

## 2017-12-26 DIAGNOSIS — J181 Lobar pneumonia, unspecified organism: Secondary | ICD-10-CM | POA: Insufficient documentation

## 2017-12-26 DIAGNOSIS — R918 Other nonspecific abnormal finding of lung field: Secondary | ICD-10-CM | POA: Insufficient documentation

## 2017-12-26 DIAGNOSIS — Z950 Presence of cardiac pacemaker: Secondary | ICD-10-CM | POA: Diagnosis not present

## 2017-12-26 DIAGNOSIS — I700 Atherosclerosis of aorta: Secondary | ICD-10-CM | POA: Insufficient documentation

## 2017-12-26 DIAGNOSIS — I712 Thoracic aortic aneurysm, without rupture: Secondary | ICD-10-CM | POA: Insufficient documentation

## 2017-12-27 NOTE — Progress Notes (Signed)
[place] [facility] Consultation      Assessment and Plan:  COPD, with chronic bronchitis and excess mucus production. -Appears adequately controlled at this time, continue Advair, combivent.    Pneumonia.  -CT chest showed bibasilar pneumonia in a bronchocentric pattern, occurred along with symptoms consistent with pneumonia.  There is a new area of infiltrate in the right middle lobe -Possibly remains of bibasilar atelectasis versus organizing pneumonia. There remains a concern of malignancy, however his systolic CHF make any biopsy risky, therefore would continue surveillance for now. -Repeat CT chest without contrast in 3-4 months.  Dyspnea on exertion.  -Patient has moderate functional status, he continues to be fairly active despite his comorbidities and advanced age. I encouraged him to continue to participate in activities. -His dyspnea is likely multifactorial from cardiac, pulmonary diseases as well as deconditioning.   Heart failure with reduced ejection fraction. 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 -Patient will be high risk for any interventional procedures including bronchoscopy.  Orders Placed This Encounter  Procedures  . CT Chest Wo Contrast   Return in about 4 months (around 04/28/2018).   Date: 12/27/2017  MRN# [id number] Patrick Figueroa. 06-13-1936    Patrick Figueroa. is a 82 y.o. old male seen in consultation for chief complaint of: possible infection   Chief Complaint  Patient presents with  . Follow-up    CT 12/26/17-pt states breathing is basleine. sob with exertion & prod cough with yellow to white mucus.     HPI:   The patient is a 82 year old male with history of chronic systolic heart failure, status post pacemaker, dilated cardiomyopathy with ejection fraction of 25%. Since his last visit he notes no new issues with his breathing. He has occasional cough which is productive of yellow mucus. No hemoptysis. He is down 7  pounds since last visit, he has been dieting.  He is using advair twice daily, combivent 4x daily.  He continues to be active.   He is fairly active, he uses a Chiropractor and goes to grocery. He does have trouble with some chores such as vacuuming. He has a special needs child that he takes to school and picks up everyday.   He is sleepy during the day, he has never been tested for sleep apnea. Their daughter has a diagnosis of OSA and is on CPAP.   **Desat walk 08/15/17; on rest on RA sat was 96% and HR 70. Walked 180 feet at moderate pace, sat was 95% ,HR 92 mild dyspnea.   Images personally reviewed; CT chest noncontrasted, 12/26/17 in comparison with 07/17/2017; Bibasilar bronchocentric infiltrates extending medially. Mild paratracheal adenopathy, there is new area of infiltrate in the distal medial segment of the RML.   Medication:    Current Outpatient Medications:  .  acetaminophen (TYLENOL) 500 MG tablet, Take 500 mg by mouth every 6 (six) hours as needed., Disp: , Rfl:  .  amLODipine (NORVASC) 10 MG tablet, Take 10 mg by mouth daily. AM, Disp: , Rfl:  .  aspirin 81 MG tablet, Take 81 mg by mouth daily. AM, Disp: , Rfl:  .  azelastine (OPTIVAR) 0.05 % ophthalmic solution, 1 drop 2 (two) times daily., Disp: , Rfl:  .  carvedilol (COREG) 12.5 MG tablet, Take 12.5 mg by mouth 2 (two) times daily with a meal., Disp: , Rfl:  .  fluticasone (FLONASE) 50 MCG/ACT nasal spray, Place into both nostrils daily. PM, Disp: , Rfl:  .  Fluticasone-Salmeterol (ADVAIR) 250-50  MCG/DOSE AEPB, Inhale 1 puff into the lungs 2 (two) times daily., Disp: , Rfl:  .  furosemide (LASIX) 40 MG tablet, Take 40 mg by mouth every 8 (eight) hours., Disp: , Rfl:  .  guaiFENesin (MUCINEX) 600 MG 12 hr tablet, Take 600 mg by mouth 2 (two) times daily as needed., Disp: , Rfl:  .  Ipratropium-Albuterol (COMBIVENT RESPIMAT) 20-100 MCG/ACT AERS respimat, Inhale 1 puff into the lungs every 6 (six) hours., Disp: , Rfl:  .   levocetirizine (XYZAL) 5 MG tablet, Take 5 mg by mouth every evening., Disp: , Rfl:  .  losartan (COZAAR) 50 MG tablet, Take 50 mg by mouth daily. PM, Disp: , Rfl:  .  meloxicam (MOBIC) 15 MG tablet, Take 15 mg by mouth daily as needed for pain., Disp: , Rfl:  .  montelukast (SINGULAIR) 10 MG tablet, Take 10 mg by mouth at bedtime., Disp: , Rfl:  .  potassium chloride (K-DUR,KLOR-CON) 10 MEQ tablet, Take 10 mEq by mouth daily. Mid day, Disp: , Rfl:    Allergies:  Patient has no known allergies.  Review of Systems: Gen:  Denies  fever, sweats, chills HEENT: Denies blurred vision, double vision. bleeds, sore throat Cvc:  No dizziness, chest pain. Resp:   Denies cough or sputum production, shortness of breath Gi: Denies swallowing difficulty, stomach pain. Gu:  Denies bladder incontinence, burning urine Ext:   No Joint pain, stiffness. Skin: No skin rash,  hives  Endoc:  No polyuria, polydipsia. Psych: No depression, insomnia. Other:  All other systems were reviewed with the patient and were negative other that what is mentioned in the HPI.   Physical Examination:   VS: BP 118/64 (BP Location: Left Arm, Cuff Size: Normal)   Pulse 100   Ht _0  (1.753 m)   Wt 224 lb (101.6 kg)   SpO2 96%   BMI 33.08 kg/m   General Appearance: No distress  Neuro:without focal findings,  speech normal,  HEENT: PERRLA, EOM intact.   Pulmonary: normal breath sounds, No wheezing.  CardiovascularNormal S1,S2.  No m/r/g.   Abdomen: Benign, Soft, non-tender. Renal:  No costovertebral tenderness  GU:  No performed at this time. Endoc: No evident thyromegaly, no signs of acromegaly. Skin:   warm, no rashes, no ecchymosis  Extremities: normal, no cyanosis, clubbing.  Other findings:    LABORATORY PANEL:   CBC No results for input(s): WBC, HGB, HCT, PLT in the last 168  hours. ------------------------------------------------------------------------------------------------------------------  Chemistries  No results for input(s): NA, K, CL, CO2, GLUCOSE, BUN, CREATININE, CALCIUM, MG, AST, ALT, ALKPHOS, BILITOT in the last 168 hours.  Invalid input(s): GFRCGP ------------------------------------------------------------------------------------------------------------------  Cardiac Enzymes No results for input(s): TROPONINI in the last 168 hours. ------------------------------------------------------------  RADIOLOGY:  Ct Chest Wo Contrast  Result Date: 12/26/2017 CLINICAL DATA:  82 year old male with recurrent pneumonia over the past 2 years. Denies symptoms. Subsequent encounter. EXAM: CT CHEST WITHOUT CONTRAST TECHNIQUE: Multidetector CT imaging of the chest was performed following the standard protocol without IV contrast. COMPARISON:  07/17/2017 CT. FINDINGS: Cardiovascular: Sequential pacemaker in place entering from the left with leads in the region of the right atrium and right ventricle. Heart size within normal limits. Aortic valve calcification. Ascending thoracic aorta measures 4.1 x 4 cm without change. Calcified plaque aortic arch. Calcified plaque origin great vessels. No obvious pulmonary artery abnormality. Mediastinum/Nodes: Increase number of normal to top-normal size mediastinal lymph nodes. Index lymph node pretracheal region with short axis dimension of 1.2 cm. No esophageal  abnormality noted. Lungs/Pleura: Persistent bilateral lower lobe consolidation with peribronchial thickening. Progressive patchy reticulonodular changes throughout the left upper lobe/lingula, right upper lobe and right middle lobe. Development of atelectatic changes most notable anterior inferior right upper lobe with associated peribronchial thickening. Upper Abdomen: Right lobe of liver 1.9 cm hypodensity probably a cyst and unchanged. No worrisome upper abdominal  abnormality. Musculoskeletal: No worrisome osseous abnormality. Scattered mild degenerative changes throughout the thoracic spine. IMPRESSION: Persistent bilateral lower lobe consolidation with peribronchial thickening. Progressive patchy reticulonodular changes throughout the left upper lobe/lingula, right upper lobe and right middle lobe. Development of atelectatic changes most notable anterior inferior right upper lobe with associated peribronchial thickening. Question result of mycobacterium avium complex (MAC) infection? Recurrent aspiration or chronic bronchitis secondary considerations. 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 Recommend annual imaging followup by CTA or MRA. This recommendation follows 2010 ACCF/AHA/AATS/ACR/ASA/SCA/SCAI/SIR/STS/SVM Guidelines for the Diagnosis and Management of Patients with Thoracic Aortic Disease. Circulation. 2010; 121: C623-J628. Pacemaker in place. Aortic Atherosclerosis (ICD10-I70.0). Electronically Signed   By: Genia Del M.D.   On: 12/26/2017 11:26       Thank  you for the consultation and for allowing [facility] [facility] to assist in the care of your patient. Our recommendations are noted above.  Please contact us if we can be of further service.   Marda Stalker, MD.  Board Certified in Internal Medicine, Pulmonary Medicine, [place], and Sleep Medicine.  [place] [facility] Office Number: [phone]  Patricia Pesa, M.D.  Merton Border, M.D  12/27/2017

## 2017-12-29 ENCOUNTER — Ambulatory Visit: Payer: Medicare Other | Admitting: Internal Medicine

## 2017-12-29 ENCOUNTER — Encounter: Payer: Self-pay | Admitting: Internal Medicine

## 2017-12-29 VITALS — BP 118/64 | HR 100 | Ht 69.0 in | Wt 224.0 lb

## 2017-12-29 DIAGNOSIS — R918 Other nonspecific abnormal finding of lung field: Secondary | ICD-10-CM | POA: Diagnosis not present

## 2017-12-29 DIAGNOSIS — R911 Solitary pulmonary nodule: Secondary | ICD-10-CM

## 2017-12-29 DIAGNOSIS — J449 Chronic obstructive pulmonary disease, unspecified: Secondary | ICD-10-CM

## 2017-12-29 NOTE — Patient Instructions (Addendum)
Continue to use your inhalers.  Continue to try to be as active as possible.   Will send you for a repeat CT chest in 3-4 months.

## 2018-01-18 ENCOUNTER — Other Ambulatory Visit: Payer: Self-pay

## 2018-01-18 DIAGNOSIS — R972 Elevated prostate specific antigen [PSA]: Secondary | ICD-10-CM

## 2018-01-22 ENCOUNTER — Other Ambulatory Visit: Payer: Medicare Other

## 2018-01-22 DIAGNOSIS — R972 Elevated prostate specific antigen [PSA]: Secondary | ICD-10-CM

## 2018-01-23 LAB — PSA: PROSTATE SPECIFIC AG, SERUM: 5.3 ng/mL — AB (ref 0.0–4.0)

## 2018-01-24 ENCOUNTER — Encounter: Payer: Self-pay | Admitting: Urology

## 2018-01-24 ENCOUNTER — Ambulatory Visit: Payer: Medicare Other | Admitting: Urology

## 2018-01-24 VITALS — BP 118/68 | HR 93 | Ht 69.0 in | Wt 222.0 lb

## 2018-01-24 DIAGNOSIS — R972 Elevated prostate specific antigen [PSA]: Secondary | ICD-10-CM | POA: Diagnosis not present

## 2018-01-24 NOTE — Progress Notes (Signed)
01/24/2018 4:28 PM   Patrick Figueroa. 31-Aug-1936 [id number]  Referring provider: Marguarite Arbour, MD [address] [facility] [place], [place] [address]  Chief Complaint  Patient presents with  . Elevated PSA    56month    HPI: 82 year old male who returns today for repeat PSA in the setting of slowly rising PSA.  He was initially referred approximately 6 months ago with a PSA trend as below.  Repeat PSA today is down from previous, 5.3 from 6.52.  Rectal exam last visit was unremarkable without nodules.  No family history of prostate cancer.    He does void frequently due to lasix.  He has no other urinary complains.  No sensation of incomplete emptying.  No dysuria or gross hematuria. He has not bothered by his urinary symptoms.  This is unchanged from his previous visit.  PSA trend is below:  12/25/2014 4.01 07/08/2015 4.15 12/30/2015 4.66 06/28/2016 4.53 12/30/2016 4.75 06/29/2017 6.52 01/22/2017 5.3  He does have multiple medical comorbidities including cardiomyopathy, CHF (EF 30%), and is status post pacemaker implant in 2019 for complete heart block.  He is on chronic aspirin.  PMH: Past Medical History:  Diagnosis Date  . Arthritis    knee  . Asthma   . Cardiomyopathy (HCC)   . CHF (congestive heart failure) (HCC)   . COPD (chronic obstructive pulmonary disease) (HCC)    emphysema  . Environmental allergies   . Hypertension   . Presence of permanent cardiac pacemaker     Surgical History: Past Surgical History:  Procedure Laterality Date  . CATARACT EXTRACTION W/PHACO Right 12/09/2015   Procedure: CATARACT EXTRACTION PHACO AND INTRAOCULAR LENS PLACEMENT (IOC);  Surgeon: Lockie Mola, MD;  Location: [facility];  Service: Ophthalmology;  Laterality: Right;  . CATARACT EXTRACTION W/PHACO Left 01/13/2016   Procedure: CATARACT EXTRACTION PHACO AND INTRAOCULAR LENS PLACEMENT (IOC);  Surgeon: Lockie Mola, MD;  Location:  [facility];  Service: Ophthalmology;  Laterality: Left;  . COLONOSCOPY    . INSERT / REPLACE / REMOVE PACEMAKER  09/29/08    Home Medications:  Allergies as of 01/24/2018   No Known Allergies     Medication List        Accurate as of 01/24/18  4:28 PM. Always use your most recent med list.          acetaminophen 500 MG tablet Commonly known as:  TYLENOL Take 500 mg by mouth every 6 (six) hours as needed.   amLODipine 10 MG tablet Commonly known as:  NORVASC Take 10 mg by mouth daily. AM   aspirin 81 MG tablet Take 81 mg by mouth daily. AM   azelastine 0.05 % ophthalmic solution Commonly known as:  OPTIVAR 1 drop 2 (two) times daily.   carvedilol 12.5 MG tablet Commonly known as:  COREG Take 12.5 mg by mouth 2 (two) times daily with a meal.   COMBIVENT RESPIMAT 20-100 MCG/ACT Aers respimat Generic drug:  Ipratropium-Albuterol Inhale 1 puff into the lungs every 6 (six) hours.   ENTRESTO 24-26 MG Generic drug:  sacubitril-valsartan   fluticasone 50 MCG/ACT nasal spray Commonly known as:  FLONASE Place into both nostrils daily. PM   Fluticasone-Salmeterol 250-50 MCG/DOSE Aepb Commonly known as:  ADVAIR Inhale 1 puff into the lungs 2 (two) times daily.   furosemide 40 MG tablet Commonly known as:  LASIX Take 40 mg by mouth every 8 (eight) hours.   guaiFENesin 600 MG 12 hr tablet Commonly known as:  MUCINEX Take 600 mg by mouth 2 (two) times daily as needed.   levocetirizine 5 MG tablet Commonly known as:  XYZAL Take 5 mg by mouth every evening.   losartan 50 MG tablet Commonly known as:  COZAAR Take 50 mg by mouth daily. PM   meloxicam 15 MG tablet Commonly known as:  MOBIC Take 15 mg by mouth daily as needed for pain.   montelukast 10 MG tablet Commonly known as:  SINGULAIR Take 10 mg by mouth at bedtime.   potassium chloride 10 MEQ tablet Commonly known as:  K-DUR,KLOR-CON Take 10 mEq by mouth daily. Mid day       Allergies: No  Known Allergies  Family History: Family History  Problem Relation Age of Onset  . Asthma Father   . Asthma Brother     Social History:  reports that  has never smoked. he has never used smokeless tobacco. He reports that he does not drink alcohol or use drugs.  ROS: UROLOGY Hard to postpone urination?: No Burning/pain with urination?: No Get up at night to urinate?: No Leakage of urine?: No Urine stream starts and stops?: No Trouble starting stream?: No Do you have to strain to urinate?: No Blood in urine?: No Urinary tract infection?: No Sexually transmitted disease?: No Injury to kidneys or bladder?: No Painful intercourse?: No Weak stream?: No Erection problems?: No Penile pain?: No  Gastrointestinal Nausea?: No Vomiting?: No Indigestion/heartburn?: No Diarrhea?: No Constipation?: No  Constitutional Fever: No Night sweats?: No Weight loss?: No Fatigue?: No  Skin Skin rash/lesions?: No Itching?: No  Eyes Blurred vision?: No Double vision?: No  Ears/Nose/Throat Sore throat?: No Sinus problems?: Yes  Hematologic/Lymphatic Swollen glands?: No Easy bruising?: No  Cardiovascular Leg swelling?: No Chest pain?: No  Respiratory Cough?: Yes Shortness of breath?: Yes  Endocrine Excessive thirst?: No  Musculoskeletal Back pain?: No Joint pain?: No  Neurological Headaches?: No Dizziness?: No  Psychologic Depression?: No Anxiety?: No  Physical Exam: BP 118/68   Pulse 93   Ht 5\' 9"  (1.753 m)   Wt 222 lb (100.7 kg)   BMI 32.78 kg/m   Constitutional:  Alert and oriented, No acute distress. HEENT: [place] AT, moist mucus membranes.  Trachea midline, no masses. Cardiovascular: No clubbing, cyanosis, or edema. Respiratory: Normal respiratory effort, no increased work of breathing. Skin: No rashes, bruises or suspicious lesions. Neurologic: Grossly intact, no focal deficits, moving all 4 extremities. Psychiatric: Normal mood and  affect.  Laboratory Data: Lab Results  Component Value Date   WBC 6.4 03/04/2012   HGB 13.0 03/04/2012   HCT 39.9 (L) 03/04/2012   MCV 86 03/04/2012   PLT 231 03/04/2012   Cr 1.2   PSA trend as above  Urinalysis N/a  Pertinent Imaging: n/a  Assessment & Plan:  82 year old male with multiple medical comorbidities as above with slowly rising PSA.  1. Elevated PSA PSA down from 6 months ago.  This is reassuring.  We discussed today given his medical comorbidities including severe cardiac disease, would defer prostate biopsy at this point in time and continue to trend PSA and decreasing frequency.  He is agreeable to this plan.  Plan for PSA in 1 year with rectal exam.  If remains stable at this point, would recommend cessation of PSA screening.  Return in about 1 year (around 01/24/2019) for PSA/ DRE.  Vanna Scotland, MD  [facility] [address] [place], [place] [address] [phone]

## 2018-04-19 ENCOUNTER — Ambulatory Visit
Admission: RE | Admit: 2018-04-19 | Discharge: 2018-04-19 | Disposition: A | Discharge status: Home / Self Care | Payer: Medicare Other | Source: Ambulatory Visit | Attending: Internal Medicine | Admitting: Internal Medicine

## 2018-04-19 DIAGNOSIS — I700 Atherosclerosis of aorta: Secondary | ICD-10-CM | POA: Diagnosis not present

## 2018-04-19 DIAGNOSIS — R918 Other nonspecific abnormal finding of lung field: Secondary | ICD-10-CM | POA: Diagnosis present

## 2018-04-19 DIAGNOSIS — I358 Other nonrheumatic aortic valve disorders: Secondary | ICD-10-CM | POA: Diagnosis not present

## 2018-04-19 DIAGNOSIS — I77810 Thoracic aortic ectasia: Secondary | ICD-10-CM | POA: Diagnosis not present

## 2018-04-23 ENCOUNTER — Telehealth: Payer: Self-pay | Admitting: Internal Medicine

## 2018-04-23 ENCOUNTER — Encounter: Payer: Self-pay | Admitting: Internal Medicine

## 2018-04-23 ENCOUNTER — Ambulatory Visit: Payer: Medicare Other | Admitting: Internal Medicine

## 2018-04-23 VITALS — BP 112/64 | HR 91 | Resp 16 | Ht 69.0 in | Wt 215.0 lb

## 2018-04-23 DIAGNOSIS — J181 Lobar pneumonia, unspecified organism: Secondary | ICD-10-CM

## 2018-04-23 DIAGNOSIS — R918 Other nonspecific abnormal finding of lung field: Secondary | ICD-10-CM | POA: Diagnosis not present

## 2018-04-23 DIAGNOSIS — J449 Chronic obstructive pulmonary disease, unspecified: Secondary | ICD-10-CM | POA: Diagnosis not present

## 2018-04-23 DIAGNOSIS — R911 Solitary pulmonary nodule: Secondary | ICD-10-CM

## 2018-04-23 MED ORDER — BENZONATATE 100 MG PO CAPS: 200.0000 mg | ORAL_CAPSULE | Freq: Three times a day (TID) | ORAL | 2 refills | Status: DC

## 2018-04-23 NOTE — Patient Instructions (Signed)
Take Mucinex DM cough syrup twice daily. Start Tessalon (benzonatate) cough pills 3 times daily.

## 2018-04-23 NOTE — Telephone Encounter (Signed)
Re-iterated direction of Tessalon Per[place].

## 2018-04-23 NOTE — Telephone Encounter (Signed)
° °  Pt c/o medication issue:  1. Name of Medication: benzonatate (TESSALON PERLES) 100 MG    2. How are you currently taking this medication (dosage and times per day)? Not sure   3. Are you having a reaction (difficulty breathing--STAT)? no  4. What is your medication issue? Wants to discuss dosage not sure how to take TID

## 2018-04-23 NOTE — Progress Notes (Signed)
[facility] [place] [facility] Consultation      Assessment and Plan:  COPD, with chronic bronchitis and excess mucus production. -Appears adequately controlled at this time, continue Advair, combivent.    Pneumonia.  -migratory infiltrates/pneumonia bilaterally. Discussed possible diagnoses, including organizing pneumonia vs. Atypical infection. Given the risks of bronchoscopy, would prefer not to go through with this.  We also discussed empiric prednisone however he is concerned about the effects on his weight, as he is currently trying to lose weight and is been trying to be more active.  Therefore we have decided to continue to monitor this, and treat his cough empirically with Tessalon and Robitussin.  Dyspnea on exertion.  -Patient has moderate functional status, he continues to be fairly active despite his comorbidities and advanced age. I encouraged him to continue to participate in activities. -His dyspnea is likely multifactorial from cardiac, pulmonary diseases as well as deconditioning.   Heart failure with reduced ejection fraction. 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 -Patient will be high risk for any interventional procedures including bronchoscopy.  Meds ordered this encounter  Medications  . benzonatate (TESSALON PERLES) 100 MG capsule    Sig: Take 2 capsules (200 mg total) by mouth 3 (three) times daily.    Dispense:  90 capsule    Refill:  2     Return in about 3 months (around 07/24/2018).   Date: 04/23/2018  MRN# [id number] Patrick Figueroa. 12/29/1935    Patrick Figueroa. is a 82 y.o. old male seen in consultation for chief complaint of: possible infection   Chief Complaint  Patient presents with  . COPD    pt states he has cough with lots of mucus (white to brownish/yellow).   . Shortness of Breath    with and without exertion  . Wheezing    as long as he stays on alllergy regimen its under control.  . Chest Pain    occasional with  cough.    HPI:   The patient is a 82 year old male with history of chronic systolic heart failure, status post pacemaker, dilated cardiomyopathy with ejection fraction of 25%. Since his last visit he notes no new issues with his breathing. He notes a progressive cough, which is productive. He has continued combivent 4 x daily, and advair bid. He feels that they are helping. He tells me that the cough can come in spasms.  He got someone to mow his yard because of allergies. He continues to be active in helping raise his grand daughter.   He is sleepy during the day, he has never been tested for sleep apnea. Their daughter has a diagnosis of OSA and is on CPAP.   **Desat walk 08/15/17; on rest on RA sat was 96% and HR 70. Walked 180 feet at moderate pace, sat was 95% ,HR 92 mild dyspnea.   Images personally reviewed; CT chest noncontrasted, 04/19/2018 in comparison with 12/26/2017 and 07/17/2017; Bibasilar bronchocentric infiltrates have continued, improved in one area, advanced others.  Suspect atypical infection versus organizing pneumonia.  Medication:    Current Outpatient Medications:  .  acetaminophen (TYLENOL) 500 MG tablet, Take 500 mg by mouth every 6 (six) hours as needed., Disp: , Rfl:  .  amLODipine (NORVASC) 10 MG tablet, Take 10 mg by mouth daily. AM, Disp: , Rfl:  .  aspirin 81 MG tablet, Take 81 mg by mouth daily. AM, Disp: , Rfl:  .  azelastine (OPTIVAR) 0.05 % ophthalmic solution, 1 drop 2 (two) times  daily., Disp: , Rfl:  .  carvedilol (COREG) 12.5 MG tablet, Take 12.5 mg by mouth 2 (two) times daily with a meal., Disp: , Rfl:  .  ENTRESTO 24-26 MG, , Disp: , Rfl: 1 .  fluticasone (FLONASE) 50 MCG/ACT nasal spray, Place into both nostrils daily. PM, Disp: , Rfl:  .  Fluticasone-Salmeterol (ADVAIR) 250-50 MCG/DOSE AEPB, Inhale 1 puff into the lungs 2 (two) times daily., Disp: , Rfl:  .  furosemide (LASIX) 40 MG tablet, Take 40 mg by mouth every 8 (eight) hours., Disp: , Rfl:  .   guaiFENesin (MUCINEX) 600 MG 12 hr tablet, Take 600 mg by mouth 2 (two) times daily as needed., Disp: , Rfl:  .  Ipratropium-Albuterol (COMBIVENT RESPIMAT) 20-100 MCG/ACT AERS respimat, Inhale 1 puff into the lungs every 6 (six) hours., Disp: , Rfl:  .  levocetirizine (XYZAL) 5 MG tablet, Take 5 mg by mouth every evening., Disp: , Rfl:  .  losartan (COZAAR) 50 MG tablet, Take 50 mg by mouth daily. PM, Disp: , Rfl:  .  meloxicam (MOBIC) 15 MG tablet, Take 15 mg by mouth daily as needed for pain., Disp: , Rfl:  .  montelukast (SINGULAIR) 10 MG tablet, Take 10 mg by mouth at bedtime., Disp: , Rfl:  .  potassium chloride (K-DUR,KLOR-CON) 10 MEQ tablet, Take 10 mEq by mouth daily. Mid day, Disp: , Rfl:    Allergies:  Patient has no known allergies.  Review of Systems: Gen:  Denies  fever, sweats, chills HEENT: Denies blurred vision, double vision. bleeds, sore throat Cvc:  No dizziness, chest pain. Resp:   Denies cough or sputum production, shortness of breath Gi: Denies swallowing difficulty, stomach pain. Gu:  Denies bladder incontinence, burning urine Ext:   No Joint pain, stiffness. Skin: No skin rash,  hives  Endoc:  No polyuria, polydipsia. Psych: No depression, insomnia. Other:  All other systems were reviewed with the patient and were negative other that what is mentioned in the HPI.   Physical Examination:   VS: BP 112/64 (BP Location: Left Arm, Cuff Size: Large)   Pulse 91   Resp 16   Ht 5\' 9"  (1.753 m)   Wt 215 lb (97.5 kg)   SpO2 90%   BMI 31.75 kg/m   General Appearance: No distress  Neuro:without focal findings,  speech normal,  HEENT: PERRLA, EOM intact.   Pulmonary: normal breath sounds, No wheezing.  CardiovascularNormal S1,S2.  No m/r/g.   Abdomen: Benign, Soft, non-tender. Renal:  No costovertebral tenderness  GU:  No performed at this time. Endoc: No evident thyromegaly, no signs of acromegaly. Skin:   warm, no rashes, no ecchymosis  Extremities: normal, no  cyanosis, clubbing.  Other findings:    LABORATORY PANEL:   CBC No results for input(s): WBC, HGB, HCT, PLT in the last 168 hours. ------------------------------------------------------------------------------------------------------------------  Chemistries  No results for input(s): NA, K, CL, CO2, GLUCOSE, BUN, CREATININE, CALCIUM, MG, AST, ALT, ALKPHOS, BILITOT in the last 168 hours.  Invalid input(s): GFRCGP ------------------------------------------------------------------------------------------------------------------  Cardiac Enzymes No results for input(s): TROPONINI in the last 168 hours. ------------------------------------------------------------  RADIOLOGY:  No results found.     Thank  you for the consultation and for allowing [facility] to assist in the care of your patient. Our recommendations are noted above.  Please contact us if we can be of further service.   Wells Guiles, MD.  Board Certified in Internal Medicine, Pulmonary Medicine, Critical Care Medicine, and Sleep Medicine.  [facility] Office Number: [phone]  Santiago Glad, M.D.  Billy Fischer, M.D  04/23/2018

## 2018-05-11 DIAGNOSIS — I428 Other cardiomyopathies: Secondary | ICD-10-CM | POA: Insufficient documentation

## 2018-05-14 ENCOUNTER — Emergency Department: Payer: Medicare Other

## 2018-05-14 ENCOUNTER — Other Ambulatory Visit: Payer: Self-pay

## 2018-05-14 ENCOUNTER — Encounter: Payer: Self-pay | Admitting: Emergency Medicine

## 2018-05-14 ENCOUNTER — Inpatient Hospital Stay
Admission: EM | Admit: 2018-05-14 | Discharge: 2018-05-16 | DRG: 291 | Disposition: A | Discharge status: Home Health | Payer: Medicare Other | Attending: Internal Medicine | Admitting: Internal Medicine

## 2018-05-14 DIAGNOSIS — I428 Other cardiomyopathies: Secondary | ICD-10-CM | POA: Diagnosis present

## 2018-05-14 DIAGNOSIS — J189 Pneumonia, unspecified organism: Secondary | ICD-10-CM | POA: Diagnosis present

## 2018-05-14 DIAGNOSIS — Z825 Family history of asthma and other chronic lower respiratory diseases: Secondary | ICD-10-CM | POA: Diagnosis not present

## 2018-05-14 DIAGNOSIS — J9601 Acute respiratory failure with hypoxia: Secondary | ICD-10-CM | POA: Diagnosis present

## 2018-05-14 DIAGNOSIS — J439 Emphysema, unspecified: Secondary | ICD-10-CM | POA: Diagnosis present

## 2018-05-14 DIAGNOSIS — J441 Chronic obstructive pulmonary disease with (acute) exacerbation: Secondary | ICD-10-CM

## 2018-05-14 DIAGNOSIS — Z9842 Cataract extraction status, left eye: Secondary | ICD-10-CM

## 2018-05-14 DIAGNOSIS — M1710 Unilateral primary osteoarthritis, unspecified knee: Secondary | ICD-10-CM | POA: Diagnosis present

## 2018-05-14 DIAGNOSIS — Z961 Presence of intraocular lens: Secondary | ICD-10-CM | POA: Diagnosis present

## 2018-05-14 DIAGNOSIS — Z79899 Other long term (current) drug therapy: Secondary | ICD-10-CM

## 2018-05-14 DIAGNOSIS — N179 Acute kidney failure, unspecified: Secondary | ICD-10-CM | POA: Diagnosis present

## 2018-05-14 DIAGNOSIS — I110 Hypertensive heart disease with heart failure: Principal | ICD-10-CM | POA: Diagnosis present

## 2018-05-14 DIAGNOSIS — Z9841 Cataract extraction status, right eye: Secondary | ICD-10-CM | POA: Diagnosis not present

## 2018-05-14 DIAGNOSIS — Z7982 Long term (current) use of aspirin: Secondary | ICD-10-CM

## 2018-05-14 DIAGNOSIS — E782 Mixed hyperlipidemia: Secondary | ICD-10-CM | POA: Diagnosis present

## 2018-05-14 DIAGNOSIS — R0902 Hypoxemia: Secondary | ICD-10-CM | POA: Diagnosis present

## 2018-05-14 DIAGNOSIS — J449 Chronic obstructive pulmonary disease, unspecified: Secondary | ICD-10-CM | POA: Diagnosis present

## 2018-05-14 DIAGNOSIS — Z86010 Personal history of colonic polyps: Secondary | ICD-10-CM | POA: Diagnosis not present

## 2018-05-14 DIAGNOSIS — Z8701 Personal history of pneumonia (recurrent): Secondary | ICD-10-CM

## 2018-05-14 DIAGNOSIS — Z791 Long term (current) use of non-steroidal anti-inflammatories (NSAID): Secondary | ICD-10-CM | POA: Diagnosis not present

## 2018-05-14 DIAGNOSIS — Z950 Presence of cardiac pacemaker: Secondary | ICD-10-CM

## 2018-05-14 DIAGNOSIS — I5023 Acute on chronic systolic (congestive) heart failure: Secondary | ICD-10-CM | POA: Diagnosis present

## 2018-05-14 LAB — BASIC METABOLIC PANEL
Anion gap: 9 (ref 5–15)
BUN: 28 mg/dL — ABNORMAL HIGH (ref 6–20)
CALCIUM: 8.3 mg/dL — AB (ref 8.9–10.3)
CO2: 24 mmol/L (ref 22–32)
CREATININE: 1.2 mg/dL (ref 0.61–1.24)
Chloride: 97 mmol/L — ABNORMAL LOW (ref 101–111)
GFR calc non Af Amer: 56 mL/min — ABNORMAL LOW (ref >60)
Glucose, Bld: 141 mg/dL — ABNORMAL HIGH (ref 65–99)
Potassium: 3.8 mmol/L (ref 3.5–5.1)
Sodium: 130 mmol/L — ABNORMAL LOW (ref 135–145)

## 2018-05-14 LAB — CBC
HCT: 33 % — ABNORMAL LOW (ref 40.0–52.0)
Hemoglobin: 10.8 g/dL — ABNORMAL LOW (ref 13.0–18.0)
MCH: 27.1 pg (ref 26.0–34.0)
MCHC: 32.8 g/dL (ref 32.0–36.0)
MCV: 82.8 fL (ref 80.0–100.0)
PLATELETS: 381 10*3/uL (ref 150–440)
RBC: 3.99 MIL/uL — AB (ref 4.40–5.90)
RDW: 14.5 % (ref 11.5–14.5)
WBC: 12.3 10*3/uL — AB (ref 3.8–10.6)

## 2018-05-14 LAB — BRAIN NATRIURETIC PEPTIDE: B Natriuretic Peptide: 167 pg/mL — ABNORMAL HIGH (ref 0.0–100.0)

## 2018-05-14 LAB — TROPONIN I
Troponin I: <0.03 ng/mL (ref <0.03)
Troponin I: <0.03 ng/mL (ref <0.03)

## 2018-05-14 LAB — TSH: TSH: 0.813 u[IU]/mL (ref 0.350–4.500)

## 2018-05-14 MED ORDER — IPRATROPIUM-ALBUTEROL 0.5-2.5 (3) MG/3ML IN SOLN
3.0000 mL | Freq: Once | RESPIRATORY_TRACT | Status: AC
  Administered 2018-05-14: 3 mL via RESPIRATORY_TRACT
  Filled 2018-05-14: qty 3

## 2018-05-14 MED ORDER — MELOXICAM 7.5 MG PO TABS
15.0000 mg | ORAL_TABLET | Freq: Every day | ORAL | Status: DC
  Administered 2018-05-16: 15 mg via ORAL
  Filled 2018-05-14 (×2): qty 2

## 2018-05-14 MED ORDER — FUROSEMIDE 10 MG/ML IJ SOLN
80.0000 mg | Freq: Once | INTRAMUSCULAR | Status: AC
  Administered 2018-05-14: 80 mg via INTRAVENOUS
  Filled 2018-05-14: qty 8

## 2018-05-14 MED ORDER — SODIUM CHLORIDE 0.9% FLUSH
3.0000 mL | Freq: Two times a day (BID) | INTRAVENOUS | Status: DC
  Administered 2018-05-14 – 2018-05-16 (×4): 3 mL via INTRAVENOUS

## 2018-05-14 MED ORDER — SODIUM CHLORIDE 0.9% FLUSH: 3.0000 mL | INTRAVENOUS | Status: DC | PRN

## 2018-05-14 MED ORDER — ONDANSETRON HCL 4 MG/2ML IJ SOLN: 4.0000 mg | Freq: Four times a day (QID) | INTRAMUSCULAR | Status: DC | PRN

## 2018-05-14 MED ORDER — CARVEDILOL 6.25 MG PO TABS
12.5000 mg | ORAL_TABLET | Freq: Two times a day (BID) | ORAL | Status: DC
  Administered 2018-05-14 – 2018-05-16 (×3): 12.5 mg via ORAL
  Filled 2018-05-14 (×3): qty 2

## 2018-05-14 MED ORDER — METHYLPREDNISOLONE SODIUM SUCC 125 MG IJ SOLR
60.0000 mg | Freq: Four times a day (QID) | INTRAMUSCULAR | Status: DC
  Administered 2018-05-14 – 2018-05-15 (×4): 60 mg via INTRAVENOUS
  Filled 2018-05-14 (×4): qty 2

## 2018-05-14 MED ORDER — GUAIFENESIN ER 600 MG PO TB12
600.0000 mg | ORAL_TABLET | Freq: Two times a day (BID) | ORAL | Status: DC | PRN
Start: 2018-05-14 — End: 2018-05-16

## 2018-05-14 MED ORDER — MONTELUKAST SODIUM 10 MG PO TABS
10.0000 mg | ORAL_TABLET | Freq: Every evening | ORAL | Status: DC
  Administered 2018-05-14 – 2018-05-15 (×2): 10 mg via ORAL
  Filled 2018-05-14 (×2): qty 1

## 2018-05-14 MED ORDER — ALPRAZOLAM 0.5 MG PO TABS: 0.2500 mg | ORAL_TABLET | Freq: Two times a day (BID) | ORAL | Status: DC | PRN

## 2018-05-14 MED ORDER — METHYLPREDNISOLONE SODIUM SUCC 125 MG IJ SOLR
125.0000 mg | Freq: Once | INTRAMUSCULAR | Status: AC
  Administered 2018-05-14: 125 mg via INTRAVENOUS
  Filled 2018-05-14: qty 2

## 2018-05-14 MED ORDER — SODIUM CHLORIDE 0.9 % IV SOLN: 250.0000 mL | INTRAVENOUS | Status: DC | PRN

## 2018-05-14 MED ORDER — IPRATROPIUM-ALBUTEROL 0.5-2.5 (3) MG/3ML IN SOLN
3.0000 mL | Freq: Four times a day (QID) | RESPIRATORY_TRACT | Status: DC
  Administered 2018-05-14 – 2018-05-16 (×7): 3 mL via RESPIRATORY_TRACT
  Filled 2018-05-14 (×7): qty 3

## 2018-05-14 MED ORDER — KETOTIFEN FUMARATE 0.025 % OP SOLN
1.0000 [drp] | Freq: Two times a day (BID) | OPHTHALMIC | Status: DC
Start: 2018-05-14 — End: 2018-05-16
  Administered 2018-05-14 – 2018-05-16 (×3): 1 [drp] via OPHTHALMIC
  Filled 2018-05-14: qty 5

## 2018-05-14 MED ORDER — MOMETASONE FURO-FORMOTEROL FUM 200-5 MCG/ACT IN AERO
2.0000 | INHALATION_SPRAY | Freq: Two times a day (BID) | RESPIRATORY_TRACT | Status: DC
  Administered 2018-05-14 – 2018-05-16 (×4): 2 via RESPIRATORY_TRACT
  Filled 2018-05-14: qty 8.8

## 2018-05-14 MED ORDER — ACETAMINOPHEN 500 MG PO TABS: 500.0000 mg | ORAL_TABLET | Freq: Four times a day (QID) | ORAL | Status: DC | PRN

## 2018-05-14 MED ORDER — BENZONATATE 100 MG PO CAPS
200.0000 mg | ORAL_CAPSULE | Freq: Three times a day (TID) | ORAL | Status: DC
  Administered 2018-05-14 – 2018-05-16 (×6): 200 mg via ORAL
  Filled 2018-05-14 (×6): qty 2

## 2018-05-14 MED ORDER — ASPIRIN EC 81 MG PO TBEC
81.0000 mg | DELAYED_RELEASE_TABLET | Freq: Every day | ORAL | Status: DC
  Administered 2018-05-15 – 2018-05-16 (×2): 81 mg via ORAL
  Filled 2018-05-14 (×2): qty 1

## 2018-05-14 MED ORDER — POTASSIUM CHLORIDE CRYS ER 20 MEQ PO TBCR
10.0000 meq | EXTENDED_RELEASE_TABLET | Freq: Every day | ORAL | Status: DC
  Administered 2018-05-14 – 2018-05-16 (×3): 10 meq via ORAL
  Filled 2018-05-14 (×3): qty 1

## 2018-05-14 MED ORDER — SACUBITRIL-VALSARTAN 24-26 MG PO TABS
1.0000 | ORAL_TABLET | Freq: Two times a day (BID) | ORAL | Status: DC
  Administered 2018-05-15 – 2018-05-16 (×2): 1 via ORAL
  Filled 2018-05-14 (×3): qty 1

## 2018-05-14 MED ORDER — CETIRIZINE HCL 10 MG PO TABS
5.0000 mg | ORAL_TABLET | Freq: Every evening | ORAL | Status: DC
  Administered 2018-05-14 – 2018-05-15 (×2): 5 mg via ORAL
  Filled 2018-05-14 (×3): qty 1

## 2018-05-14 MED ORDER — ACETAMINOPHEN 325 MG PO TABS: 650.0000 mg | ORAL_TABLET | ORAL | Status: DC | PRN

## 2018-05-14 MED ORDER — FLUTICASONE PROPIONATE 50 MCG/ACT NA SUSP
1.0000 | Freq: Every day | NASAL | Status: DC
  Filled 2018-05-14: qty 16

## 2018-05-14 MED ORDER — HEPARIN SODIUM (PORCINE) 5000 UNIT/ML IJ SOLN
5000.0000 [IU] | Freq: Three times a day (TID) | INTRAMUSCULAR | Status: DC
  Administered 2018-05-14 – 2018-05-15 (×2): 5000 [IU] via SUBCUTANEOUS
  Filled 2018-05-14 (×2): qty 1

## 2018-05-14 MED ORDER — FUROSEMIDE 10 MG/ML IJ SOLN
20.0000 mg | Freq: Two times a day (BID) | INTRAMUSCULAR | Status: DC
  Administered 2018-05-15: 20 mg via INTRAVENOUS
  Filled 2018-05-14: qty 2

## 2018-05-14 NOTE — ED Notes (Signed)
Report to Leslie Dales, [place]

## 2018-05-14 NOTE — H&P (Signed)
[facility] [place] [facility]   PATIENT NAME: Patrick Figueroa    MR#:  [id number]  DATE OF BIRTH:  [birth year]  DATE OF ADMISSION:  05/14/2018  PRIMARY CARE PHYSICIAN: Marguarite Arbour, MD   REQUESTING/REFERRING PHYSICIAN:   CHIEF COMPLAINT:   Chief Complaint  Patient presents with  . Shortness of Breath    HISTORY OF PRESENT ILLNESS: Daven Montz  is a 82 y.o. male with a known history per below which also includes nonischemic cardiomyopathy with ejection fraction of 25%, seen by cardiology/Dr. Gwen Pounds last week and was told that he will need AICD placement in the future, presents the emergency room with 3 to 4-day history of shortness of breath, worsening swelling in his legs, nonproductive cough, shortness of breath worse with activity, inability to lay flat in bed, ER work-up noted for BNP 167, white count 12,000, chest x-ray negative, sodium 130, chloride 97, BUN 28, O2 saturation in the 80s on arrival, placed on nasal cannula with improvement, patient evaluated emergency room, no apparent distress, resting comfortably in bed, patient now be admitted for acute hypoxic respiratory failure secondary to acute on COPD exacerbation >>>> acute on chronic systolic congestive heart failure exacerbation.  PAST MEDICAL HISTORY:   Past Medical History:  Diagnosis Date  . Arthritis    knee  . Asthma   . Cardiomyopathy (HCC)   . CHF (congestive heart failure) (HCC)   . COPD (chronic obstructive pulmonary disease) (HCC)    emphysema  . Environmental allergies   . Hypertension   . Presence of permanent cardiac pacemaker     PAST SURGICAL HISTORY:  Past Surgical History:  Procedure Laterality Date  . CATARACT EXTRACTION W/PHACO Right 12/09/2015   Procedure: CATARACT EXTRACTION PHACO AND INTRAOCULAR LENS PLACEMENT (IOC);  Surgeon: Lockie Mola, MD;  Location: [facility];  Service: Ophthalmology;  Laterality: Right;  . CATARACT EXTRACTION W/PHACO Left  01/13/2016   Procedure: CATARACT EXTRACTION PHACO AND INTRAOCULAR LENS PLACEMENT (IOC);  Surgeon: Lockie Mola, MD;  Location: [facility];  Service: Ophthalmology;  Laterality: Left;  . COLONOSCOPY    . INSERT / REPLACE / REMOVE PACEMAKER  09/29/08    SOCIAL HISTORY:  Social History   Tobacco Use  . Smoking status: Never Smoker  . Smokeless tobacco: Never Used  Substance Use Topics  . Alcohol use: No    FAMILY HISTORY:  Family History  Problem Relation Age of Onset  . Asthma Father   . Asthma Brother     DRUG ALLERGIES:  NKDA  REVIEW OF SYSTEMS:   CONSTITUTIONAL: No fever, +fatigue, weakness.  EYES: No blurred or double vision.  EARS, NOSE, AND THROAT: No tinnitus or ear pain.  RESPIRATORY: + cough, shortness of breath, wheezing, no hemoptysis.  CARDIOVASCULAR: No chest pain, +orthopnea, edema.  GASTROINTESTINAL: No nausea, vomiting, diarrhea or abdominal pain.  GENITOURINARY: No dysuria, hematuria.  ENDOCRINE: No polyuria, nocturia,  HEMATOLOGY: No anemia, easy bruising or bleeding SKIN: No rash or lesion. MUSCULOSKELETAL: No joint pain or arthritis.   NEUROLOGIC: No tingling, numbness, weakness.  PSYCHIATRY: No anxiety or depression.   MEDICATIONS AT HOME:  Prior to Admission medications   Medication Sig Start Date End Date Taking? Authorizing Provider  acetaminophen (TYLENOL) 325 MG tablet Take 500 mg by mouth every 6 (six) hours as needed for mild pain.    Yes [provider]  aspirin 81 MG tablet Take 81 mg by mouth daily. AM   Yes [provider]  azelastine Leafy Kindle)  0.05 % ophthalmic solution Place 1 drop into both eyes 2 (two) times daily as needed (allergies).    Yes [provider]  benzonatate (TESSALON PERLES) 100 MG capsule Take 2 capsules (200 mg total) by mouth 3 (three) times daily. 04/23/18 04/23/19 Yes Shane Crutch, MD  carvedilol (COREG) 12.5 MG tablet Take 12.5 mg by mouth 2 (two) times daily with a  meal.   Yes [provider]  ENTRESTO 24-26 MG Take 1 tablet by mouth 2 (two) times daily.  12/29/17  Yes [provider]  fluticasone (FLONASE) 50 MCG/ACT nasal spray Place 1 spray into both nostrils daily. PM    Yes [provider]  Fluticasone-Salmeterol (ADVAIR) 250-50 MCG/DOSE AEPB Inhale 1 puff into the lungs 2 (two) times daily.   Yes [provider]  guaiFENesin (MUCINEX) 600 MG 12 hr tablet Take 600 mg by mouth 2 (two) times daily as needed for to loosen phlegm.    Yes [provider]  Ipratropium-Albuterol (COMBIVENT RESPIMAT) 20-100 MCG/ACT AERS respimat Inhale 1 puff into the lungs every 6 (six) hours.   Yes [provider]  levocetirizine (XYZAL) 5 MG tablet Take 5 mg by mouth every evening.   Yes [provider]  meloxicam (MOBIC) 15 MG tablet Take 15 mg by mouth daily as needed for pain.   Yes [provider]  montelukast (SINGULAIR) 10 MG tablet Take 10 mg by mouth every evening.    Yes [provider]  potassium chloride (K-DUR,KLOR-CON) 10 MEQ tablet Take 10 mEq by mouth daily. Mid day   Yes [provider]  torsemide (DEMADEX) 20 MG tablet Take 20 mg by mouth 2 (two) times daily. 04/17/18 04/17/19 Yes [provider]      PHYSICAL EXAMINATION:   VITAL SIGNS: Blood pressure 100/62, pulse 82, temperature 98.2 F (36.8 C), temperature source Oral, resp. rate (!) 36, weight 98.4 kg (217 lb), SpO2 90 %.  GENERAL:  82 y.o.-year-old patient lying in the bed with no acute distress.  Frail, obese diminished breath sounds at bases bilaterally EYES: Pupils equal, round, reactive to light and accommodation. No scleral icterus. Extraocular muscles intact.  HEENT: Head atraumatic, normocephalic. Oropharynx and nasopharynx clear.  NECK:  Supple, no jugular venous distention. No thyroid enlargement, no tenderness.  LUNGS: Diminished breath sounds bilaterally . No use of accessory muscles of  respiration.  CARDIOVASCULAR: S1, S2 normal. No murmurs, rubs, or gallops.  ABDOMEN: Soft, nontender, nondistended. Bowel sounds present. No organomegaly or mass.  EXTREMITIES: Bilateral lower extremity pitting edema, no cyanosis, or clubbing.  NEUROLOGIC: Cranial nerves II through XII are intact. MAES. Gait not checked.  PSYCHIATRIC: The patient is alert and oriented x 3.  SKIN: No obvious rash, lesion, or ulcer.   LABORATORY PANEL:   CBC Recent Labs  Lab 05/14/18 1311  WBC 12.3*  HGB 10.8*  HCT 33.0*  PLT 381  MCV 82.8  MCH 27.1  MCHC 32.8  RDW 14.5   ------------------------------------------------------------------------------------------------------------------  Chemistries  Recent Labs  Lab 05/14/18 1311  NA 130*  K 3.8  CL 97*  CO2 24  GLUCOSE 141*  BUN 28*  CREATININE 1.20  CALCIUM 8.3*   ------------------------------------------------------------------------------------------------------------------ estimated creatinine clearance is 57.8 mL/min (by C-G formula based on SCr of 1.2 mg/dL). ------------------------------------------------------------------------------------------------------------------ No results for input(s): TSH, T4TOTAL, T3FREE, THYROIDAB in the last 72 hours.  Invalid input(s): FREET3   Coagulation profile No results for input(s): INR, PROTIME in the last 168 hours. ------------------------------------------------------------------------------------------------------------------- No results for input(s): DDIMER  in the last 72 hours. -------------------------------------------------------------------------------------------------------------------  Cardiac Enzymes Recent Labs  Lab 05/14/18 1311  TROPONINI <0.03   ------------------------------------------------------------------------------------------------------------------ Invalid input(s):  POCBNP  ---------------------------------------------------------------------------------------------------------------  Urinalysis No results found for: COLORURINE, APPEARANCEUR, LABSPEC, PHURINE, GLUCOSEU, HGBUR, BILIRUBINUR, KETONESUR, PROTEINUR, UROBILINOGEN, NITRITE, LEUKOCYTESUR   RADIOLOGY: Dg Chest 2 View  Result Date: 05/14/2018 CLINICAL DATA:  Shortness of breath. EXAM: CHEST - 2 VIEW COMPARISON:  CT 04/19/2018. FINDINGS: Lateral view degraded by patient arm position. Midline trachea. Pacer with leads at right atrium and right ventricle. No lead discontinuity. Borderline cardiomegaly, accentuated by AP portable frontal radiograph and low lung volumes. No definite pleural fluid. No pneumothorax. Pulmonary interstitial prominence is favored to be technique related. Lingular scarring. No new pulmonary consolidation. IMPRESSION: Borderline cardiomegaly and low lung volumes. No definite acute disease. Lingular scarring. Electronically Signed   By: Jeronimo Greaves M.D.   On: 05/14/2018 13:31    EKG: Orders placed or performed during the hospital encounter of 05/14/18  . ED EKG within 10 minutes  . ED EKG within 10 minutes  . EKG 12-Lead  . EKG 12-Lead    IMPRESSION AND PLAN: *Acute hypoxic respiratory failure Secondary to multifactorial process which includes acute on COPD exacerbation >>> acute on chronic systolic congestive heart failure exacerbation Admit to regular nursing for bed, supplemental oxygen weaning as tolerated  *Acute on COPD exacerbation IV Solu-Medrol with tapering as tolerated, inhaled corticosteroids twice daily, aggressive pulmonary toilet with bronchodilator therapy, respiratory therapy to see, consult pulmonology for expert opinion, supplemental oxygen wean as tolerated  *Acute on chronic systolic cCHF - mild, NICM EF 16% Exacerbated by above Seen by cardiology last week/Dr. Gwen Pounds and was noted to need AICD placement in the future, status post pacer  placement for complete heart block Congestive heart failure protocol, aspirin, Coreg, Entresto, strict I&O monitoring, daily weights, IV Lasix twice daily, cardiology to see, check echocardiogram, rule out acute coronary syndrome with cardiac enzymes x3 sets  *Chronic benign essential hypertension Stable back Discontinue Norvasc indefinitely given edema, continue home regiment, hydralazine as needed systolic blood pressure greater than 160, vitals per routine, make changes as per necessary    All the records are reviewed and case discussed with ED provider. Management plans discussed with the patient, family and they are in agreement.  CODE STATUS:full   TOTAL TIME TAKING CARE OF THIS PATIENT: 45 minutes.    Evelena Asa Hakeen Shipes M.D on 05/14/2018   Between 7am to 6pm - Pager - [phone]  After 6pm go to [email or web address] - password [email or web address]  [facility]  Office  [phone]  CC: Primary care physician; Marguarite Arbour, MD   Note: This dictation was prepared with Dragon dictation along with smaller phrase technology. Any transcriptional errors that result from this process are unintentional.

## 2018-05-14 NOTE — ED Triage Notes (Signed)
Pt to ed via ems from home with c/o sob, increased swelling in feet and ankles, and nonproductive cough.  Pt was seen by dr. Gwen Pounds on Friday for same. Per pt he is taking his meds as prescribed but the fluid continues to increase. Pt also reports increased sob with activity.  Pt reports he sleeps in recliner because he becomes severely sob when laying flat.  Pt with paced rhythm on monitor HR 82.  Pt with noted +3-4 pitting edema in bilat lower extremities.  Pt sats on room air are 89% and pt with labored resp at rest.  Pt placed on O2 at 2 lpm via [place] and sats up to 94%, states the o2 makes him feel better.

## 2018-05-14 NOTE — ED Provider Notes (Signed)
[facility] Emergency Department Provider Note ____________________________________________   First MD Initiated Contact with Patient 05/14/18 1255     (approximate)  I have reviewed the triage vital signs and the nursing notes.   HISTORY  Chief Complaint Shortness of Breath    HPI Patrick Jamyron Redd. is a 82 y.o. male with PMH as noted below including history of CHF and COPD who presents with increasing shortness of breath over the last 3 to 4 days, gradual onset, persistent course, and associated with lower extremity swelling and nonproductive cough.  He states that he takes Lasix 20 mg twice daily and this is not changed recently.  He does not use oxygen at home.  He saw his cardiologist last week and was told that he likely will need an AICD.  Past Medical History:  Diagnosis Date  . Arthritis    knee  . Asthma   . Cardiomyopathy (HCC)   . CHF (congestive heart failure) (HCC)   . COPD (chronic obstructive pulmonary disease) (HCC)    emphysema  . Environmental allergies   . Hypertension   . Presence of permanent cardiac pacemaker     Patient Active Problem List   Diagnosis Date Noted  . Reactive airway disease 07/25/2017  . Mixed hyperlipidemia 07/25/2017  . Hyperglycemia, unspecified 07/25/2017  . Complete heart block (HCC) 07/25/2017  . Allergic state 07/25/2017  . Allergic rhinitis 07/25/2017  . Allergic conjunctivitis 07/25/2017  . Bilateral leg edema 04/13/2017  . Hx of adenomatous colonic polyps 06/24/2016  . Cardiac pacemaker 10/05/2015  . Benign essential hypertension 10/05/2015  . Chronic systolic CHF (congestive heart failure), NYHA class 3 (HCC) 10/21/2014    Past Surgical History:  Procedure Laterality Date  . CATARACT EXTRACTION W/PHACO Right 12/09/2015   Procedure: CATARACT EXTRACTION PHACO AND INTRAOCULAR LENS PLACEMENT (IOC);  Surgeon: Lockie Mola, MD;  Location: [facility];  Service: Ophthalmology;   Laterality: Right;  . CATARACT EXTRACTION W/PHACO Left 01/13/2016   Procedure: CATARACT EXTRACTION PHACO AND INTRAOCULAR LENS PLACEMENT (IOC);  Surgeon: Lockie Mola, MD;  Location: [facility];  Service: Ophthalmology;  Laterality: Left;  . COLONOSCOPY    . INSERT / REPLACE / REMOVE PACEMAKER  09/29/08    Prior to Admission medications   Medication Sig Start Date End Date Taking? Authorizing Provider  acetaminophen (TYLENOL) 325 MG tablet Take 500 mg by mouth every 6 (six) hours as needed for mild pain.    Yes [provider]  amLODipine (NORVASC) 10 MG tablet Take 10 mg by mouth daily. AM   Yes [provider]  aspirin 81 MG tablet Take 81 mg by mouth daily. AM   Yes [provider]  azelastine (OPTIVAR) 0.05 % ophthalmic solution Place 1 drop into both eyes 2 (two) times daily as needed (allergies).    Yes [provider]  benzonatate (TESSALON PERLES) 100 MG capsule Take 2 capsules (200 mg total) by mouth 3 (three) times daily. 04/23/18 04/23/19 Yes Shane Crutch, MD  carvedilol (COREG) 12.5 MG tablet Take 12.5 mg by mouth 2 (two) times daily with a meal.   Yes [provider]  ENTRESTO 24-26 MG Take 1 tablet by mouth 2 (two) times daily.  12/29/17  Yes [provider]  fluticasone (FLONASE) 50 MCG/ACT nasal spray Place 1 spray into both nostrils daily. PM    Yes [provider]  Fluticasone-Salmeterol (ADVAIR) 250-50 MCG/DOSE AEPB Inhale 1 puff into the lungs 2 (two) times daily.   Yes [provider]  guaiFENesin (MUCINEX) 600 MG 12 hr tablet Take 600 mg by mouth 2 (two) times daily as needed for to loosen phlegm.    Yes [provider]  Ipratropium-Albuterol (COMBIVENT RESPIMAT) 20-100 MCG/ACT AERS respimat Inhale 1 puff into the lungs every 6 (six) hours.   Yes [provider]  levocetirizine (XYZAL) 5 MG tablet Take 5 mg by mouth every evening.   Yes [provider]    meloxicam (MOBIC) 15 MG tablet Take 15 mg by mouth daily as needed for pain.   Yes [provider]  montelukast (SINGULAIR) 10 MG tablet Take 10 mg by mouth every evening.    Yes [provider]  potassium chloride (K-DUR,KLOR-CON) 10 MEQ tablet Take 10 mEq by mouth daily. Mid day   Yes [provider]  torsemide (DEMADEX) 20 MG tablet Take 20 mg by mouth 2 (two) times daily. 04/17/18 04/17/19 Yes [provider]    Allergies Patient has no known allergies.  Family History  Problem Relation Age of Onset  . Asthma Father   . Asthma Brother     Social History Social History   Tobacco Use  . Smoking status: Never Smoker  . Smokeless tobacco: Never Used  Substance Use Topics  . Alcohol use: No  . Drug use: No    Review of Systems  Constitutional: No fever. Eyes: No redness. ENT: No sore throat. Cardiovascular: Denies chest pain. Respiratory: Positive for shortness of breath. Gastrointestinal: No vomiting. Genitourinary: Negative for dysuria.  Musculoskeletal: Negative for back pain. Skin: Negative for rash. Neurological: Negative for headache.   ____________________________________________   PHYSICAL EXAM:  VITAL SIGNS: ED Triage Vitals  Enc Vitals Group     BP 05/14/18 1249 103/62     Pulse Rate 05/14/18 1249 80     Resp 05/14/18 1249 (!) 24     Temp 05/14/18 1249 98.2 F (36.8 C)     Temp Source 05/14/18 1249 Oral     SpO2 05/14/18 1248 (!) 89 %     Weight 05/14/18 1249 217 lb (98.4 kg)     Height --      Head Circumference --      Peak Flow --      Pain Score --      Pain Loc --      Pain Edu? --      Excl. in GC? --     Constitutional: Alert and oriented.  Slightly uncomfortable appearing but in no acute distress. Eyes: Conjunctivae are normal.  EOMI. Head: Atraumatic. Nose: No congestion/rhinnorhea. Mouth/Throat: Mucous membranes are moist.   Neck: Normal range of motion.  Cardiovascular: Normal rate, regular  rhythm. Grossly normal heart sounds.  Good peripheral circulation. Respiratory: Normal respiratory effort.  No retractions.  Decreased breath sounds bilaterally with scattered wheezes. Gastrointestinal: Soft and nontender.   Genitourinary: No flank tenderness. Musculoskeletal: 3+ bilateral lower extremity edema.  Extremities warm and well perfused.  Neurologic:  Normal speech and language. No gross focal neurologic deficits are appreciated.  Skin:  Skin is warm and dry. No rash noted. Psychiatric: Mood and affect are normal. Speech and behavior are normal.  ____________________________________________   LABS (all labs ordered are listed, but only abnormal results are displayed)  Labs Reviewed  BASIC METABOLIC PANEL - Abnormal; Notable for the following components:      Result Value   Sodium 130 (*)    Chloride 97 (*)    Glucose, Bld 141 (*)    BUN  28 (*)    Calcium 8.3 (*)    GFR calc non Af Amer 56 (*)    All other components within normal limits  CBC - Abnormal; Notable for the following components:   WBC 12.3 (*)    RBC 3.99 (*)    Hemoglobin 10.8 (*)    HCT 33.0 (*)    All other components within normal limits  BRAIN NATRIURETIC PEPTIDE - Abnormal; Notable for the following components:   B Natriuretic Peptide 167.0 (*)    All other components within normal limits  TROPONIN I   ____________________________________________  EKG  ED ECG REPORT I, Dionne Bucy, the attending physician, personally viewed and interpreted this ECG.  Date: 05/14/2018 EKG Time: 1239 Rate: 83 Rhythm: Atrial sensed ventricular paced rhythm QRS Axis: normal Intervals: normal ST/T Wave abnormalities: normal Narrative Interpretation: no evidence of acute ischemia  ____________________________________________  RADIOLOGY  CXR: Borderline cardiomegaly with no other focal acute findings  ____________________________________________   PROCEDURES  Procedure(s) performed:  No  Procedures  Critical Care performed: No ____________________________________________   INITIAL IMPRESSION / ASSESSMENT AND PLAN / ED COURSE  Pertinent labs & imaging results that were available during my care of the patient were reviewed by me and considered in my medical decision making (see chart for details).  82 year old male with history of COPD, CHF, and other PMH as noted above presents with worsening shortness of breath and worsening bilateral lower extremity edema over the last several days.  On exam, the vital signs are normal except that the patient is hypoxic on room air (he is not normally on home O2).  The remainder the exam is as described above with some scattered wheezes and decreased lung sounds, as well as significant peripheral edema.  Chest x-ray shows no significant acute findings.  Given this and the exam, presentation is more consistent with acute COPD rather than CHF, however there is likely component of worsening CHF and fluid overload as well.  Plan: IV Lasix, nebulizer, steroid, and likely admission given that the patient is newly hypoxic.    ----------------------------------------- 3:23 PM on 05/14/2018 -----------------------------------------  Patient is feeling slightly better after the initial treatment.  Work-up is more consistent with acute COPD than CHF.  I signed the patient out to the hospitalist Dr. salary for admission.  ____________________________________________   FINAL CLINICAL IMPRESSION(S) / ED DIAGNOSES  Final diagnoses:  COPD exacerbation (HCC)  Hypoxia      NEW MEDICATIONS STARTED DURING THIS VISIT:  New Prescriptions   No medications on file     Note:  This document was prepared using Dragon voice recognition software and may include unintentional dictation errors.    Dionne Bucy, MD 05/14/18 [phone]

## 2018-05-14 NOTE — Progress Notes (Signed)
Family Meeting Note  Advance Directive:yes  Today a meeting took place with the Patient.  Patient is able to participate   The following clinical team members were present during this meeting:MD  The following were discussed:Patient's diagnosis: Heart failure, cardiomyopathy, COPD, hypertension, restored failure, Patient's progosis: Unable to determine and Goals for treatment: Full Code  Additional follow-up to be provided: prn  Time spent during discussion:20 minutes  Bertrum Sol, MD

## 2018-05-14 NOTE — ED Notes (Signed)
Pt taken to floor by Miguel Dibble, Tammy on cardiac monitor and [place]. All of belongings sent with patient.

## 2018-05-15 ENCOUNTER — Inpatient Hospital Stay: Admit: 2018-05-15 | Payer: Medicare Other

## 2018-05-15 LAB — BASIC METABOLIC PANEL
ANION GAP: 9 (ref 5–15)
BUN: 31 mg/dL — ABNORMAL HIGH (ref 6–20)
CALCIUM: 8.7 mg/dL — AB (ref 8.9–10.3)
CO2: 25 mmol/L (ref 22–32)
CREATININE: 1.12 mg/dL (ref 0.61–1.24)
Chloride: 102 mmol/L (ref 101–111)
Glucose, Bld: 253 mg/dL — ABNORMAL HIGH (ref 65–99)
Potassium: 4.5 mmol/L (ref 3.5–5.1)
SODIUM: 136 mmol/L (ref 135–145)

## 2018-05-15 LAB — TROPONIN I: Troponin I: <0.03 ng/mL (ref <0.03)

## 2018-05-15 MED ORDER — FUROSEMIDE 10 MG/ML IJ SOLN
40.0000 mg | Freq: Two times a day (BID) | INTRAMUSCULAR | Status: DC
  Administered 2018-05-15 – 2018-05-16 (×2): 40 mg via INTRAVENOUS
  Filled 2018-05-15 (×2): qty 4

## 2018-05-15 MED ORDER — ENOXAPARIN SODIUM 40 MG/0.4ML ~~LOC~~ SOLN
40.0000 mg | SUBCUTANEOUS | Status: DC
  Administered 2018-05-15: 40 mg via SUBCUTANEOUS
  Filled 2018-05-15: qty 0.4

## 2018-05-15 NOTE — Progress Notes (Signed)
Pt placed on overnight pulse ox. Pt is on room air. Pt is in the recliner and states that he sleeps in a recliner at home and doesn't want to sleep in bed. Pt's RN aware

## 2018-05-15 NOTE — Progress Notes (Signed)
Cardiovascular and Pulmonary Nurse Navigator Note:  82 year old male with known history of NICM with EF of 20% from echo performed earlier this year.  Also hx of permanent cardiac pacemaker (2009) secondary to CHB, HLD, HTN.  Patient presented to the ED with 3 to 4 day history of SOB, worsening swelling in his legs, nonproductive cough, SOB with exertion, inability to lay flat in bed.  BNP 167, WBC 12,000, CXR - negative, oxygen saturation 80s on arrival to ED.  Patient admitted with dx of acute hypoxic respiratory failure seondary to acute on chronic COPD exacerbation and acute on chronic systolic congestive heart failure.  NOTE:  Patient was seen recently by Dr. Gwen Pounds, patient's cardiologist and was informed he would need AICD placed.  Patient was seen by Dr. Valera Castle MD, last Friday.  Per patient, Dr. Maisie Fus is making arrangements for patient to have AICD placed at [facility].    CHF Education completed with patient, wife, daughter, and grand-daughter.  Patient sitting up in the recliner with oxygen via  in place;  lower extremities elevated.   Provided patient with "Living Better with Heart Failure" packet. Briefly reviewed definition of heart failure and signs and symptoms of an exacerbation. Discussed the meaning of EF with patient and family.    Patient stopped this RN and wanted to talk about the need for the AICD placement at [facility].  Patient explained to me the plan:  Per Dr. Maisie Fus, [facility] MD, patient to be diuresed as an inpatient the day before his procedure; then have the procedure the next day.  Patient's question/request/concern is to have this hospitalization to serve as the diuresis bridge to the AICD procedure if at all possible.  Patient wanting to discuss this with Dr. Allena Katz.  This RN talked with Dr. Eliane Decree about patient's concerns.  Dr. Allena Katz in to speak with patient and family.  Dr. Allena Katz plans to call MD at [facility] to see what arrangements can be made for patient's procedure.     *Reviewed importance of and reason behind checking weight daily in the AM, after using the bathroom, but before getting dressed. Patient has scales and reports he had been checking his weight daily up until 3 -  4 days ago when he became so weak and SOB.    Reviewed the following information with patient/family:  *Discussed when to call the Dr= weight gain of >2-3lb overnight of 5lb in a week,  *Discussed yellow zone= call MD: weight gain of >2-3lb overnight of 5lb in a week, increased swelling, increased SOB when lying down, chest discomfort, dizziness, increased fatigue. ?? *Red Zone= call 911: struggle to breath, fainting or near fainting, significant chest pain. ?   *Reviewed low sodium diet-provided handout of recommended and not recommended foods. Dietitian saw patient earlier today to educate about low sodium heart healthy diet. Discussed fluid intake with patient.  Patient currently on 1200 ml fluid restriction.  Demonstrated to patient and wife what that volume looks like by using the bedside water pitcher.    ? *Instructed patient to take medications as prescribed for heart failure. Explained briefly why pt is on the medications (either make you feel better, live longer or keep you out of the hospital) and discussed monitoring and side effects.   *Discussed exercise. Explained to patient and family patient is a candidate for Cardiac Rehab per Medicare guidelines with an EF of 20%.  Benefits of participating in Cardiac Rehab reviewed with patient/family.  Explained per Medicare guidelines patient  would need to be out of the hospital and stable for six weeks before participating in Cardiac Rehab.  Patient would also need to get AICD and recover from procedure prior to participating.  Patient interested in participating. Will follow patient while hospitalized.    *Smoking Cessation - patient is a NEVER smoker.   *[facility] - Explained the role of the [facility] Failure  Clinic. Explained to patient that the [facility] HF Clinic does not replace his cardiologist nor PCP, but provides an additional resource for him to help treat his HF and keep him out of the hospital.  Again, patient is not opposed to being seen in the HF Clinic, but wants to get the AICD procedure as soon as possible.    Patient / family thanked me for my time in reviewing this information with them.    Army Melia, RN, BSN, [facility]? [facility]  [facility] Cardiac &?Pulmonary Rehab  Cardiovascular &?Pulmonary Nurse Navigator  Direct Line: [phone]  Department Phone #: [phone] Fax: [phone]? Email Address: [email or web address]

## 2018-05-15 NOTE — Progress Notes (Signed)
Inpatient Diabetes Program Recommendations  AACE/ADA: New Consensus Statement on Inpatient Glycemic Control (2015)  Target Ranges:  Prepandial:   less than 140 mg/dL      Peak postprandial:   less than 180 mg/dL (1-2 hours)      Critically ill patients:  140 - 180 mg/dL   No results found for: GLUCAP, HGBA1C Results for ZEBBIE, AUGUSTA (MRN [id number]) as of 05/15/2018 11:51  Ref. Range 05/15/2018 04:33  Glucose Latest Ref Range: 65 - 99 mg/dL 937 (H)   Inpatient Diabetes Program Recommendations:    No history of DM.  Note that patient is currently on IV steroids.  May consider adding Novolog moderate correction tid with meals and HS while on steroids.   Thanks,  Beryl Meager, RN, BC-ADM Inpatient Diabetes Coordinator Pager [phone] (8a-5p)

## 2018-05-15 NOTE — Progress Notes (Signed)
[facility] [place] [facility]   PATIENT NAME: Patrick Figueroa    MR#:  [id number]  DATE OF BIRTH:  02-Jul-1936  SUBJECTIVE:    REVIEW OF SYSTEMS:   ROS Tolerating Diet: Tolerating PT:   DRUG ALLERGIES:  No Known Allergies  VITALS:  Blood pressure 128/77, pulse 92, temperature (!) 97.5 F (36.4 C), temperature source Oral, resp. rate 18, height 5\' 9"  (1.753 m), weight 97.6 kg (215 lb 3.2 oz), SpO2 96 %.  PHYSICAL EXAMINATION:   Physical Exam  GENERAL:  82 y.o.-year-old patient lying in the bed with no acute distress.  EYES: Pupils equal, round, reactive to light and accommodation. No scleral icterus. Extraocular muscles intact.  HEENT: Head atraumatic, normocephalic. Oropharynx and nasopharynx clear.  NECK:  Supple, no jugular venous distention. No thyroid enlargement, no tenderness.  LUNGS: decreased breath sounds bilaterally, no wheezing, rales, rhonchi. No use of accessory muscles of respiration.  CARDIOVASCULAR: S1, S2 normal. No murmurs, rubs, or gallops.  ABDOMEN: Soft, nontender, nondistended. Bowel sounds present. No organomegaly or mass.  EXTREMITIES: No cyanosis, clubbing  ++ edema b/l.    NEUROLOGIC: Cranial nerves II through XII are intact. No focal Motor or sensory deficits b/l.   PSYCHIATRIC:  patient is alert and oriented x 3.  SKIN: No obvious rash, lesion, or ulcer.   LABORATORY PANEL:  CBC Recent Labs  Lab 05/14/18 1311  WBC 12.3*  HGB 10.8*  HCT 33.0*  PLT 381    Chemistries  Recent Labs  Lab 05/15/18 0433  NA 136  K 4.5  CL 102  CO2 25  GLUCOSE 253*  BUN 31*  CREATININE 1.12  CALCIUM 8.7*   Cardiac Enzymes Recent Labs  Lab 05/15/18 0433  TROPONINI <0.03   RADIOLOGY:  Dg Chest 2 View  Result Date: 05/14/2018 CLINICAL DATA:  Shortness of breath. EXAM: CHEST - 2 VIEW COMPARISON:  CT 04/19/2018. FINDINGS: Lateral view degraded by patient arm position. Midline trachea. Pacer with leads at right atrium and  right ventricle. No lead discontinuity. Borderline cardiomegaly, accentuated by AP portable frontal radiograph and low lung volumes. No definite pleural fluid. No pneumothorax. Pulmonary interstitial prominence is favored to be technique related. Lingular scarring. No new pulmonary consolidation. IMPRESSION: Borderline cardiomegaly and low lung volumes. No definite acute disease. Lingular scarring. Electronically Signed   By: Jeronimo Greaves M.D.   On: 05/14/2018 13:31   ASSESSMENT AND PLAN:   Patrick Figueroa  is a 82 y.o. male with a known history per below which also includes nonischemic cardiomyopathy with ejection fraction of 25%, seen by cardiology/Dr. Gwen Pounds last week and was told that he will need AICD placement in the future, presents the emergency room with 3 to 4-day history of shortness of breath, worsening swelling in his legs, nonproductive cough, shortness of breath worse with activity.  *Acute hypoxic respiratory failure due to acute on chronic systolic congestive heart failure exacerbation supplemental oxygen weaning as tolerated -IV Lasix 40 mg BID. Monitor input output, creatinine, wean oxygen as tolerated -cardiology consultation with Dr. Juliann Pares -had recently and echo done as outpatient EF is 20 to 25%. He has history of non-dilated cardiomyopathy. -Also scheduled to have AICD placement at [facility] for evaluation by Dr. Gerre Pebbles EP -he has history of pacemaker that was placed in 2009  *Acute on chronic systolic cCHF - mild, NICM EF 97% Exacerbated by above Seen by cardiology last week/Dr. Gwen Pounds and was noted to need AICD placement in the future, status post pacer  placement for complete heart block -aspirin, Coreg, Entresto, strict I&O monitoring, daily weights -dietitian to see patient -pt has  been set up with cardiac rehab as well. -He has an appointment with CHF clinic   *Chronic benign essential hypertension Stable back Discontinue Norvasc indefinitely given edema,  continue home regiment, hydralazine as needed systolic blood pressure greater than 160, vitals per routine, make changes as per necessary  Above Was discussed with patient, daughter and wife in the room   Case discussed with Care Management/Social Worker. Management plans discussed with the patient, family and they are in agreement.  CODE STATUS:FULL  DVT Prophylaxis: lovenox  TOTAL TIME TAKING CARE OF THIS PATIENT: *30* minutes.  >50% time spent on counselling and coordination of care  POSSIBLE D/C IN 1-2  DAYS, DEPENDING ON CLINICAL CONDITION.  Note: This dictation was prepared with Dragon dictation along with smaller phrase technology. Any transcriptional errors that result from this process are unintentional.  Enedina Finner M.D on 05/15/2018 at 3:31 PM  Between 7am to 6pm - Pager - [phone]  After 6pm go to [email or web address] - Social research officer, government  [facility]  Office  [phone]  CC: Primary care physician; Marguarite Arbour, MDPatient ID: Patrick Dilling., male   DOB: 30-Nov-1936, 82 y.o.   MRN: [id number]

## 2018-05-15 NOTE — Consult Note (Signed)
[facility] Consultation      Assessment and Plan:  COPD, with chronic bronchitis and excess mucus production. -Appears adequately controlled at this time, continue Advair, combivent.    Pneumonia.  -migratory infiltrates/pneumonia bilaterally. Discussed possible diagnoses, including organizing pneumonia vs. Atypical infection.  Continue to monitor, treat empirically with Tessalon and Robitussin.   Dyspnea on exertion.  -Patient has moderate functional status, he continues to be fairly active despite his comorbidities and advanced age. I encouraged him to continue to participate in activities. -His dyspnea is likely multifactorial from cardiac, pulmonary diseases as well as deconditioning.   Heart failure with reduced ejection fraction. 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 -Patient will be high risk for any interventional procedures including bronchoscopy.      Date: 05/15/2018  MRN# [id number] Patrick Figueroa. 1936-04-29  Referring Physician: Dr. Katheren Shams.   Patrick Figueroa. is a 82 y.o. old male seen in consultation for chief complaint of:    Chief Complaint  Patient presents with  . Shortness of Breath    HPI:  The patient is a 82 year old male, with a history of chronic systolic congestive heart failure, chronic dyspnea on exertion, COPD, previous pneumonia.  Patient presented to the ED on 05/14/2018 with progressive dyspnea on exertion.  He also noticed lower extremity edema and cough.  He was found to be mildly hypoxic, he was started on Lasix, steroids. BNP at the time of admission was 167, troponins have been negative.  Laboratory studies have been unremarkable other than some mild acute kidney injury which has improved.  Patient is currently be treated with Solu-Medrol 60 mg every 6, nebulizers, Lasix 20 mg twice daily.  Currently the patient is on 2 L nasal cannula with oxygen saturation of 96%.   Images personally reviewed;  chest x-ray 05/14/2018, possible left lower lobe atelectasis/infiltrate.  CT chest noncontrasted, 04/19/2018 in comparison with 12/26/2017 and 07/17/2017; Bibasilar bronchocentric infiltrates have continued, improved in one area, advanced others.  Suspect atypical infection versus organizing pneumonia.    PMHX:   Past Medical History:  Diagnosis Date  . Arthritis    knee  . Asthma   . Cardiomyopathy (HCC)   . CHF (congestive heart failure) (HCC)   . COPD (chronic obstructive pulmonary disease) (HCC)    emphysema  . Environmental allergies   . Hypertension   . Presence of permanent cardiac pacemaker    Surgical Hx:  Past Surgical History:  Procedure Laterality Date  . CATARACT EXTRACTION W/PHACO Right 12/09/2015   Procedure: CATARACT EXTRACTION PHACO AND INTRAOCULAR LENS PLACEMENT (IOC);  Surgeon: Lockie Mola, MD;  Location: [facility];  Service: Ophthalmology;  Laterality: Right;  . CATARACT EXTRACTION W/PHACO Left 01/13/2016   Procedure: CATARACT EXTRACTION PHACO AND INTRAOCULAR LENS PLACEMENT (IOC);  Surgeon: Lockie Mola, MD;  Location: [facility];  Service: Ophthalmology;  Laterality: Left;  . COLONOSCOPY    . INSERT / REPLACE / REMOVE PACEMAKER  09/29/08   Family Hx:  Family History  Problem Relation Age of Onset  . Asthma Father   . Asthma Brother    Social Hx:   Social History   Tobacco Use  . Smoking status: Never Smoker  . Smokeless tobacco: Never Used  Substance Use Topics  . Alcohol use: No  . Drug use: No   Medication:    Current Facility-Administered Medications:  .  0.9 %  sodium chloride infusion, 250 mL, Intravenous, PRN, Salary, Montell D, MD .  acetaminophen (TYLENOL) tablet 500  mg, 500 mg, Oral, Q6H PRN, Salary, Montell D, MD .  acetaminophen (TYLENOL) tablet 650 mg, 650 mg, Oral, Q4H PRN, Salary, Montell D, MD .  ALPRAZolam Prudy Feeler) tablet 0.25 mg, 0.25 mg, Oral, BID PRN, Salary, Montell D, MD .  aspirin EC tablet 81 mg,  81 mg, Oral, Daily, Salary, Montell D, MD, 81 mg at 05/15/18 0903 .  benzonatate (TESSALON) capsule 200 mg, 200 mg, Oral, TID, Salary, Montell D, MD, 200 mg at 05/15/18 0902 .  carvedilol (COREG) tablet 12.5 mg, 12.5 mg, Oral, BID WC, Salary, Montell D, MD, 12.5 mg at 05/14/18 1741 .  cetirizine (ZYRTEC) tablet 5 mg, 5 mg, Oral, QPM, Salary, Montell D, MD, 5 mg at 05/14/18 1741 .  fluticasone (FLONASE) 50 MCG/ACT nasal spray 1 spray, 1 spray, Each Nare, Daily, Salary, Montell D, MD .  furosemide (LASIX) injection 20 mg, 20 mg, Intravenous, BID, Salary, Montell D, MD, 20 mg at 05/15/18 0901 .  guaiFENesin (MUCINEX) 12 hr tablet 600 mg, 600 mg, Oral, BID PRN, Salary, Montell D, MD .  heparin injection 5,000 Units, 5,000 Units, Subcutaneous, Q8H, Salary, Montell D, MD, 5,000 Units at 05/15/18 6962 .  ipratropium-albuterol (DUONEB) 0.5-2.5 (3) MG/3ML nebulizer solution 3 mL, 3 mL, Nebulization, QID, Salary, Montell D, MD, 3 mL at 05/15/18 0806 .  ketotifen (ZADITOR) 0.025 % ophthalmic solution 1 drop, 1 drop, Both Eyes, BID, Salary, Montell D, MD, 1 drop at 05/14/18 2159 .  meloxicam (MOBIC) tablet 15 mg, 15 mg, Oral, Daily, Salary, Montell D, MD .  methylPREDNISolone sodium succinate (SOLU-MEDROL) 125 mg/2 mL injection 60 mg, 60 mg, Intravenous, Q6H, Salary, Montell D, MD, 60 mg at 05/15/18 0901 .  mometasone-formoterol (DULERA) 200-5 MCG/ACT inhaler 2 puff, 2 puff, Inhalation, BID, Salary, Montell D, MD, 2 puff at 05/14/18 2157 .  montelukast (SINGULAIR) tablet 10 mg, 10 mg, Oral, QPM, Salary, Montell D, MD, 10 mg at 05/14/18 1741 .  ondansetron (ZOFRAN) injection 4 mg, 4 mg, Intravenous, Q6H PRN, Salary, Montell D, MD .  potassium chloride SA (K-DUR,KLOR-CON) CR tablet 10 mEq, 10 mEq, Oral, Daily, Salary, Montell D, MD, 10 mEq at 05/15/18 0903 .  sacubitril-valsartan (ENTRESTO) 24-26 mg per tablet, 1 tablet, Oral, BID, Salary, Montell D, MD, 1 tablet at 05/15/18 0902 .  sodium chloride flush (NS) 0.9  % injection 3 mL, 3 mL, Intravenous, Q12H, Salary, Montell D, MD, 3 mL at 05/15/18 0905 .  sodium chloride flush (NS) 0.9 % injection 3 mL, 3 mL, Intravenous, PRN, Salary, Montell D, MD   Allergies:  Patient has no known allergies.  Review of Systems: Gen:  Denies  fever, sweats, chills HEENT: Denies blurred vision, double vision. bleeds, sore throat Cvc:  No dizziness, chest pain. Resp:   Denies cough or sputum production, shortness of breath Gi: Denies swallowing difficulty, stomach pain. Gu:  Denies bladder incontinence, burning urine Ext:   No Joint pain, stiffness. Skin: No skin rash,  hives  Endoc:  No polyuria, polydipsia. Psych: No depression, insomnia. Other:  All other systems were reviewed with the patient and were negative other that what is mentioned in the HPI.   Physical Examination:   VS: BP 107/67 (BP Location: Left Arm)   Pulse 81   Temp 97.6 F (36.4 C) (Oral)   Resp 15   Ht 5\' 9"  (1.753 m)   Wt 215 lb 3.2 oz (97.6 kg)   SpO2 96%   BMI 31.78 kg/m   General Appearance: No distress  Neuro:without focal  findings,  speech normal,  HEENT: PERRLA, EOM intact.   Pulmonary: normal breath sounds, No wheezing.  CardiovascularNormal S1,S2.  No m/r/g.   Abdomen: Benign, Soft, non-tender. Renal:  No costovertebral tenderness  GU:  No performed at this time. Endoc: No evident thyromegaly, no signs of acromegaly. Skin:   warm, no rashes, no ecchymosis  Extremities: normal, no cyanosis, clubbing.  Other findings:    LABORATORY PANEL:   CBC Recent Labs  Lab 05/14/18 1311  WBC 12.3*  HGB 10.8*  HCT 33.0*  PLT 381   ------------------------------------------------------------------------------------------------------------------  Chemistries  Recent Labs  Lab 05/15/18 0433  NA 136  K 4.5  CL 102  CO2 25  GLUCOSE 253*  BUN 31*  CREATININE 1.12  CALCIUM 8.7*    ------------------------------------------------------------------------------------------------------------------  Cardiac Enzymes Recent Labs  Lab 05/15/18 0433  TROPONINI <0.03   ------------------------------------------------------------  RADIOLOGY:  Dg Chest 2 View  Result Date: 05/14/2018 CLINICAL DATA:  Shortness of breath. EXAM: CHEST - 2 VIEW COMPARISON:  CT 04/19/2018. FINDINGS: Lateral view degraded by patient arm position. Midline trachea. Pacer with leads at right atrium and right ventricle. No lead discontinuity. Borderline cardiomegaly, accentuated by AP portable frontal radiograph and low lung volumes. No definite pleural fluid. No pneumothorax. Pulmonary interstitial prominence is favored to be technique related. Lingular scarring. No new pulmonary consolidation. IMPRESSION: Borderline cardiomegaly and low lung volumes. No definite acute disease. Lingular scarring. Electronically Signed   By: Jeronimo Greaves M.D.   On: 05/14/2018 13:31       Thank  you for the consultation and for allowing [facility] to assist in the care of your patient. Our recommendations are noted above.  Please contact us if we can be of further service.   Wells Guiles, MD.  Board Certified in Internal Medicine, Pulmonary Medicine, Critical Care Medicine, and Sleep Medicine.  [facility] Office Number: [phone]  Santiago Glad, M.D.  Billy Fischer, M.D  05/15/2018

## 2018-05-15 NOTE — Plan of Care (Addendum)
Rounded with MD, no complains of pain throughout shift, Pt resting in room comfortably. Pt  is New CHF, dietary consult pending. Will continue to monitor. Problem: Education: Goal: Knowledge of General Education information will improve Outcome: Progressing   Problem: Education: Goal: Ability to demonstrate management of disease process will improve Outcome: Progressing   Problem: Cardiac: Goal: Ability to achieve and maintain adequate cardiopulmonary perfusion will improve Outcome: Progressing

## 2018-05-15 NOTE — Evaluation (Signed)
Physical Therapy Evaluation Patient Details Name: Patrick Figueroa. MRN: [id number] DOB: [birth year] Today's Date: 05/15/2018   History of Present Illness  Pt is a81 y.o.malewith a known historythat includes: CHF, COPD, HTN, and presence of a caridac pacemaker which also includes nonischemic cardiomyopathy with ejection fraction of 25%, seen by cardiology/Dr. Gwen Pounds last week and was told that he will need AICD placement in the future, presented the emergency room with 3 to 4-day history of shortness of breath, worsening swelling in his legs, nonproductive cough, shortness of breath worse with activity, inability to lay flat in bed, ER work-up noted for BNP 167, white count 12,000, chest x-ray negative, sodium 130, chloride 97, BUN 28, O2 saturation in the 80s on arrival, placed on nasal cannula with improvement.  Patient now admitted for acute hypoxic respiratory failure secondary to acute on chronic COPD exacerbation and acute on chronic systolic congestive heart failure exacerbation.    Clinical Impression  Pt presents with mild deficits in strength, transfers, mobility, and gait and with moderate deficits activity tolerance.  Pt required increased time and effort during bed mobility tasks but no physical assistance.  Pt was steady during transfers and during static standing balance training with feet apart, eyes closed, and head turns.  Pt was able to amb a max of 50' with a RW on 2LO2/min with min SOB after ambulating.  Pt's SpO2 91% with HR 100 bpm after amb compared to baselines of 92% and 94 bpm.  Pt reported PLOF including amb without an AD but reported that he felt safer with better stability with a RW at this time secondary to general weakness compared to baseline.  Pt will benefit from HHPT services upon discharge to safely address above deficits for decreased caregiver assistance and eventual return to PLOF.      Follow Up Recommendations Home health PT    Equipment Recommendations   Rolling walker with 5" wheels    Recommendations for Other Services       Precautions / Restrictions Precautions Precautions: None Restrictions Weight Bearing Restrictions: No      Mobility  Bed Mobility Overal bed mobility: Modified Independent             General bed mobility comments: Extra time and effort required for bed mobility tasks but no assistance needed  Transfers Overall transfer level: Modified independent Equipment used: Rolling walker (2 wheeled) Transfers: Sit to/from Stand Sit to Stand: Supervision         General transfer comment: Good control and stability with transfers  Ambulation/Gait Ambulation/Gait assistance: Modified independent (Device/Increase time);Supervision Ambulation Distance (Feet): 50 Feet Assistive device: Rolling walker (2 wheeled) Gait Pattern/deviations: Step-through pattern;Decreased step length - right;Decreased step length - left;Trunk flexed Gait velocity: Decreased   General Gait Details: Min verbal cues required for upright posture and amb closer to the RW with pt steady during amb without LOB  Stairs            Wheelchair Mobility    Modified Rankin (Stroke Patients Only)       Balance Overall balance assessment: No apparent balance deficits (not formally assessed)                                           Pertinent Vitals/Pain Pain Assessment: No/denies pain    Home Living Family/patient expects to be discharged to:: Private residence Living Arrangements: Spouse/significant other;Children Available  Help at Discharge: Family;Available 24 hours/day Type of Home: House Home Access: Stairs to enter Entrance Stairs-Rails: Right Entrance Stairs-Number of Steps: 2 Home Layout: One level Home Equipment: Walker - 4 wheels;Cane - single point      Prior Function Level of Independence: Independent         Comments: Ind amb without AD with no fall history, Ind with ADLs      Hand Dominance   Dominant Hand: Right    Extremity/Trunk Assessment   Upper Extremity Assessment Upper Extremity Assessment: Overall WFL for tasks assessed    Lower Extremity Assessment Lower Extremity Assessment: Generalized weakness       Communication   Communication: No difficulties  Cognition Arousal/Alertness: Awake/alert Behavior During Therapy: WFL for tasks assessed/performed Overall Cognitive Status: Within Functional Limits for tasks assessed                                        General Comments      Exercises Total Joint Exercises Ankle Circles/Pumps: AROM;Both;10 reps;5 reps Quad Sets: Strengthening;Both;10 reps;5 reps Gluteal Sets: Strengthening;Both;5 reps;10 reps Heel Slides: AROM;Both;5 reps Hip ABduction/ADduction: AROM;Both;5 reps Long Arc Quad: AROM;Both;10 reps;15 reps Knee Flexion: AROM;Both;10 reps;15 reps Marching in Standing: AROM;Both;10 reps;Seated;Standing Other Exercises Other Exercises: HEP education and review for BLE APs, QS, and GS x 10 each 5-6x/day   Assessment/Plan    PT Assessment Patient needs continued PT services  PT Problem List Decreased strength;Decreased activity tolerance;Decreased knowledge of use of DME;Decreased mobility       PT Treatment Interventions DME instruction;Gait training;Stair training;Functional mobility training;Therapeutic exercise;Therapeutic activities;Balance training;Patient/family education    PT Goals (Current goals can be found in the Care Plan section)  Acute Rehab PT Goals Patient Stated Goal: To get stronger PT Goal Formulation: With patient Time For Goal Achievement: 05/28/18 Potential to Achieve Goals: Good    Frequency Min 2X/week   Barriers to discharge        Co-evaluation               AM-PAC PT "6 Clicks" Daily Activity  Outcome Measure Difficulty turning over in bed (including adjusting bedclothes, sheets and blankets)?: A Little Difficulty  moving from lying on back to sitting on the side of the bed? : A Little Difficulty sitting down on and standing up from a chair with arms (e.g., wheelchair, bedside commode, etc,.)?: None Help needed moving to and from a bed to chair (including a wheelchair)?: None Help needed walking in hospital room?: None Help needed climbing 3-5 steps with a railing? : A Little 6 Click Score: 21    End of Session Equipment Utilized During Treatment: Gait belt;Oxygen Activity Tolerance: Patient tolerated treatment well Patient left: in bed;with call bell/phone within reach Nurse Communication: Mobility status PT Visit Diagnosis: Difficulty in walking, not elsewhere classified (R26.2);Muscle weakness (generalized) (M62.81)    Time: 8119-1478 PT Time Calculation (min) (ACUTE ONLY): 35 min   Charges:   PT Evaluation $PT Eval Low Complexity: 1 Low PT Treatments $Therapeutic Exercise: 8-22 mins   PT G Codes:        DElly Modena PT, DPT 05/15/18, 12:04 PM

## 2018-05-15 NOTE — Plan of Care (Signed)
Nutrition Education Note  RD consulted for nutrition education regarding new onset CHF. Spoke with pt at bedside and pt's wife on the phone per pt's request.  Pt reports that he has been "trying to lay off the sodium" and eating "baked or broiled meat" instead of fried meat. Pt typically eats 3 meals daily. Breakfast may include raisin bran cereal with 2% milk or sausage, eggs, toast, coffee, and juice. Lunch may include a [employer] burger or a grilled chicken salad or sandwich. Dinner may include baked chicken with gravy, rice, and mashed potatoes or meatloaf.  RD provided "Low Sodium Nutrition Therapy" handout from the Academy of [employer] and [employer]etics. Reviewed patient's dietary recall. Provided examples on ways to decrease sodium intake in diet including discontinuing use of salt shaker when cooking. Discouraged intake of processed foods and use of salt shaker. Encouraged fresh fruits and vegetables as well as whole grain sources of carbohydrates to maximize fiber intake.   RD discussed why it is important for patient to adhere to diet recommendations, and emphasized the role of fluids, foods to avoid, and importance of weighing self daily. Teach back method used.  Expect good compliance.  Body mass index is 31.78 kg/m. Pt meets criteria for Obesity Class I based on current BMI.  Current diet order is 2 gram sodium, patient is consuming approximately 100% of meals at this time.  Labs and medications reviewed. No further nutrition interventions warranted at this time. If additional nutrition issues arise, please re-consult RD.   Earma Reading, MS, RD, LDN Pager: [phone] Weekend/After Hours: [phone]

## 2018-05-15 NOTE — Care Management Note (Addendum)
Case Management Note  Patient Details  Name: Patrick Figueroa. MRN: [id number] Date of Birth: 1936-12-12  Subjective/Objective: Met with patient and his wife at bedside. Patient would benfit from home health PT, RN and a walker. Discuss discharge planning with patient and he is in agreement with no agency preference. Referral to [facility] with [facility]. He is very concerned with getting his defibrillator and the date. Prior to admission patient was independent, active and driving. PCP is Dr. Doy Hutching.                     Action/Plan: May need home O2 at DC  Expected Discharge Date:                  Expected Discharge Plan:  [place]  In-House Referral:     Discharge planning Services  CM Consult  Post Acute Care Choice:  Durable Medical Equipment, Home Health Choice offered to:  Patient  DME Arranged:  Walker rolling DME Agency:  [place] Arranged:  RN, PT [facility] Agency:  [place]  Status of Service:  In process, will continue to follow  If discussed at Long Length of Stay Meetings, dates discussed:    Additional Comments:  Jolly Mango, RN 05/15/2018, 3:11 PM

## 2018-05-16 DIAGNOSIS — J439 Emphysema, unspecified: Secondary | ICD-10-CM

## 2018-05-16 LAB — BASIC METABOLIC PANEL
Anion gap: 8 (ref 5–15)
BUN: 29 mg/dL — AB (ref 6–20)
CHLORIDE: 105 mmol/L (ref 101–111)
CO2: 24 mmol/L (ref 22–32)
CREATININE: 0.91 mg/dL (ref 0.61–1.24)
Calcium: 8.6 mg/dL — ABNORMAL LOW (ref 8.9–10.3)
GFR calc Af Amer: >60 mL/min (ref >60)
GFR calc non Af Amer: >60 mL/min (ref >60)
GLUCOSE: 187 mg/dL — AB (ref 65–99)
POTASSIUM: 4.3 mmol/L (ref 3.5–5.1)
Sodium: 137 mmol/L (ref 135–145)

## 2018-05-16 NOTE — Consult Note (Signed)
Reason for Consult: Shortness of breath respiratory failure congestive heart failure Referring Physician: Dr. Clare Figueroa [facility] hospitalist primary physician [facility] Cardiologist Dr. Imlay Desanctis [name]. is an 82 y.o. male.  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 Nehemiah Figueroa patient has pacemaker in place for sick sinus syndrome is currently being evaluated for possible AICD placement at [facility] because of significant heart failure.  Patient been treated with Delene Loll for heart failure he also has significant COPD progressive leg swelling he had a nonproductive cough shortness of breath with activity over the last few weeks he has had PND and orthopnea.  Patient finally came to the emergency room for evaluation had elevated BNP slightly elevated white count chest x-ray was unremarkable patient had sats in 80s she was admitted for hypoxemia possible COPD heart failure management therapy.  Patient denied any chest pain no blackout spells or syncope  Past Medical History:  Diagnosis Date  . Arthritis    knee  . Asthma   . Cardiomyopathy ([place])   . CHF (congestive heart failure) ([place])   . COPD (chronic obstructive pulmonary disease) (HCC)    emphysema  . Environmental allergies   . Hypertension   . Presence of permanent cardiac pacemaker     Past Surgical History:  Procedure Laterality Date  . CATARACT EXTRACTION W/PHACO Right 12/09/2015   Procedure: CATARACT EXTRACTION PHACO AND INTRAOCULAR LENS PLACEMENT ([place]);  Surgeon: Leandrew Koyanagi, MD;  Location: [place];  Service: Ophthalmology;  Laterality: Right;  . CATARACT EXTRACTION W/PHACO Left 01/13/2016   Procedure: CATARACT EXTRACTION PHACO AND INTRAOCULAR LENS PLACEMENT ([facility]);  Surgeon: Leandrew Koyanagi, MD;  Location: [place];  Service: Ophthalmology;  Laterality: Left;  . COLONOSCOPY    . INSERT / REPLACE / REMOVE PACEMAKER   09/29/08    Family History  Problem Relation Age of Onset  . Asthma Father   . Asthma Brother     Social History:  reports that he has never smoked. He has never used smokeless tobacco. He reports that he does not drink alcohol or use drugs.  Allergies: No Known Allergies  Medications: I have reviewed the patient's current medications.  Results for orders placed or performed during the hospital encounter of 05/14/18 (from the past 48 hour(s))  Basic metabolic panel     Status: Abnormal   Collection Time: 05/14/18  1:11 PM  Result Value Ref Range   Sodium 130 (L) 135 - 145 mmol/L   Potassium 3.8 3.5 - 5.1 mmol/L   Chloride 97 (L) 101 - 111 mmol/L   CO2 24 22 - 32 mmol/L   Glucose, Bld 141 (H) 65 - 99 mg/dL   BUN 28 (H) 6 - 20 mg/dL   Creatinine, Ser 1.20 0.61 - 1.24 mg/dL   Calcium 8.3 (L) 8.9 - 10.3 mg/dL   GFR calc non Af Amer 56 (L) >60 mL/min   GFR calc Af Amer >60 >60 mL/min    Comment: (NOTE) The eGFR has been calculated using the CKD EPI equation. This calculation has not been validated in all clinical situations. eGFR's persistently <60 mL/min signify possible Chronic Kidney Disease.    Anion gap 9 5 - 15    Comment: Performed at [facility], [address] [place], [place] [address]  CBC     Status: Abnormal   Collection Time: 05/14/18  1:11 PM  Result Value Ref Range   WBC 12.3 (H) 3.8 - 10.6  K/uL   RBC 3.99 (L) 4.40 - 5.90 MIL/uL   Hemoglobin 10.8 (L) 13.0 - 18.0 g/dL   HCT 33.0 (L) 40.0 - 52.0 %   MCV 82.8 80.0 - 100.0 fL   MCH 27.1 26.0 - 34.0 pg   MCHC 32.8 32.0 - 36.0 g/dL   RDW 14.5 11.5 - 14.5 %   Platelets 381 150 - 440 K/uL    Comment: Performed at [facility], [address] [place], [place] [address]  Troponin I     Status: None   Collection Time: 05/14/18  1:11 PM  Result Value Ref Range   Troponin I <0.03 <0.03 ng/mL    Comment: Performed at [facility], [address] [place], [place] [address]  Brain  natriuretic peptide     Status: Abnormal   Collection Time: 05/14/18  1:11 PM  Result Value Ref Range   B Natriuretic Peptide 167.0 (H) 0.0 - 100.0 pg/mL    Comment: Performed at [facility], [address] [place], [place] [address]  Troponin I     Status: None   Collection Time: 05/14/18  5:02 PM  Result Value Ref Range   Troponin I <0.03 <0.03 ng/mL    Comment: Performed at [facility], [address] [place], [place] [address]  TSH     Status: None   Collection Time: 05/14/18  5:02 PM  Result Value Ref Range   TSH 0.813 0.350 - 4.500 uIU/mL    Comment: Performed by a 3rd Generation assay with a functional sensitivity of <=0.01 uIU/mL. Performed at [facility], [address] [place], [place] [address]   Troponin I     Status: None   Collection Time: 05/14/18 10:43 PM  Result Value Ref Range   Troponin I <0.03 <0.03 ng/mL    Comment: Performed at [facility], [address] [place], [place] [address]  Troponin I     Status: None   Collection Time: 05/15/18  4:33 AM  Result Value Ref Range   Troponin I <0.03 <0.03 ng/mL    Comment: Performed at [facility], [address] [place], [place] [address]  Basic metabolic panel     Status: Abnormal   Collection Time: 05/15/18  4:33 AM  Result Value Ref Range   Sodium 136 135 - 145 mmol/L   Potassium 4.5 3.5 - 5.1 mmol/L   Chloride 102 101 - 111 mmol/L   CO2 25 22 - 32 mmol/L   Glucose, Bld 253 (H) 65 - 99 mg/dL   BUN 31 (H) 6 - 20 mg/dL   Creatinine, Ser 1.12 0.61 - 1.24 mg/dL   Calcium 8.7 (L) 8.9 - 10.3 mg/dL   GFR calc non Af Amer >60 >60 mL/min   GFR calc Af Amer >60 >60 mL/min    Comment: (NOTE) The eGFR has been calculated using the CKD EPI equation. This calculation has not been validated in all clinical situations. eGFR's persistently <60 mL/min signify possible Chronic Kidney Disease.    Anion gap 9 5 - 15    Comment: Performed at [facility],  [address] [place],  [address]  Basic metabolic panel     Status: Abnormal   Collection Time: 05/16/18  3:10 AM  Result Value Ref Range   Sodium 137 135 - 145 mmol/L   Potassium 4.3 3.5 - 5.1 mmol/L   Chloride 105 101 - 111 mmol/L   CO2 24 22 - 32 mmol/L  Glucose, Bld 187 (H) 65 - 99 mg/dL   BUN 29 (H) 6 - 20 mg/dL   Creatinine, Ser 0.91 0.61 - 1.24 mg/dL   Calcium 8.6 (L) 8.9 - 10.3 mg/dL   GFR calc non Af Amer >60 >60 mL/min   GFR calc Af Amer >60 >60 mL/min    Comment: (NOTE) The eGFR has been calculated using the CKD EPI equation. This calculation has not been validated in all clinical situations. eGFR's persistently <60 mL/min signify possible Chronic Kidney Disease.    Anion gap 8 5 - 15    Comment: Performed at [facility], [address] [place], [place] [address]    Dg Chest 2 View  Result Date: 05/14/2018 CLINICAL DATA:  Shortness of breath. EXAM: CHEST - 2 VIEW COMPARISON:  CT 04/19/2018. FINDINGS: Lateral view degraded by patient arm position. Midline trachea. Pacer with leads at right atrium and right ventricle. No lead discontinuity. Borderline cardiomegaly, accentuated by AP portable frontal radiograph and low lung volumes. No definite pleural fluid. No pneumothorax. Pulmonary interstitial prominence is favored to be technique related. Lingular scarring. No new pulmonary consolidation. IMPRESSION: Borderline cardiomegaly and low lung volumes. No definite acute disease. Lingular scarring. Electronically Signed   By: Abigail Miyamoto M.D.   On: 05/14/2018 13:31    Review of Systems  Constitutional: Positive for malaise/fatigue.  HENT: Positive for congestion.   Eyes: Negative.   Respiratory: Positive for shortness of breath.   Cardiovascular: Positive for palpitations, orthopnea, leg swelling and PND.  Gastrointestinal: Negative.   Genitourinary: Negative.   Musculoskeletal: Positive for myalgias.  Skin: Negative.   Neurological: Negative.    Endo/Heme/Allergies: Negative.   Psychiatric/Behavioral: Negative.    Blood pressure (!) 116/101, pulse 84, temperature 98.7 F (37.1 C), temperature source Oral, resp. rate 18, height _0  (1.753 m), weight 214 lb 6.4 oz (97.3 kg), SpO2 93 %. Physical Exam  Nursing note and vitals reviewed. Constitutional: He is oriented to person, place, and time. He appears well-developed and well-nourished.  HENT:  Head: Normocephalic and atraumatic.  Eyes: Pupils are equal, round, and reactive to light. Conjunctivae and EOM are normal.  Neck: Normal range of motion. Neck supple.  Cardiovascular: Normal rate and regular rhythm. Exam reveals gallop.  Murmur heard. Respiratory: Effort normal and breath sounds normal.  GI: Soft. Bowel sounds are normal.  Musculoskeletal: Normal range of motion. He exhibits edema.  Neurological: He is alert and oriented to person, place, and time. He has normal reflexes.  Skin: Skin is dry.  Psychiatric: He has a normal mood and affect.    Assessment/Plan: Acute hypoxic respiratory failure COPD exacerbation Congestive heart failure nonischemic Cardiomyopathy ejection fraction around 25% Leg edema Hypertension Sick sinus syndrome . Plan Agree with a telemetry admission evaluation Recommend supplemental oxygen therapy Continue inhalers for COPD symptoms Steroids and inhalers diuretics Continue blood pressure control Continue heart failure therapy with Entresto Referred patient to AICD placement for nonischemic heart failure Consider pacemaker evaluation Discontinue amlodipine for leg edema Status post stockings and elevation Increase activity  Patrick Figueroa D Patrick Figueroa 05/16/2018, 9:21 AM

## 2018-05-16 NOTE — Progress Notes (Signed)
Pt asleep in recliner on room air. O2 saturation is 94% and HR is 71

## 2018-05-16 NOTE — Care Management Note (Signed)
Case Management Note  Patient Details  Name: Patrick Figueroa. MRN: [id number] Date of Birth: 1936-01-06  Subjective/Objective:  Discharging today                   Action/Plan: Dan Humphreys delivered to room by  [facility] today. [facility] notified of discharge.   Expected Discharge Date:  05/16/18               Expected Discharge Plan:  Home w Home Health Services  In-House Referral:     Discharge planning Services  CM Consult  Post Acute Care Choice:  Durable Medical Equipment, Home Health Choice offered to:  Patient  DME Arranged:  Walker rolling DME Agency:  [facility]  HH Arranged:  RN, PT [facility] Agency:  [facility]  Status of Service:  Completed, signed off  If discussed at Long Length of Stay Meetings, dates discussed:    Additional Comments:  Marily Memos, RN 05/16/2018, 12:41 PM

## 2018-05-16 NOTE — Progress Notes (Signed)
Cardiovascular and Pulmonary Nurse Navigator Note:  Rounded on patient to follow-up on the CHF education provided to patient and wife yesterday.  Patient sitting up in the recliner chair.  Wife at bedside.  Patient stating that he feels much better and is being discharged today.    Patient understands that an RN and PT will be seeing him in the home.  CM RN setting patient up with rolling walker.    Patient hoping to have AICD placed at [facility] by the end of the week.  Per patient [facility] Cardiologist to contact patient within 48 hours of discharge with an appointment date and time for this procedure.  Explained to patient and wife that when patient has completed in-home PT then he will be able to start Cardiac Rehab.  Patient and wife agreeable with plan.    Reviewed the 5 steps to Living Better with Heart Failure: 1. Weigh daily 2. Assess weight / symptoms according to the HF zones and take action (Call MD) if symptoms are in the yellow or red zone.  3. Take all medications as prescribed. 4. Follow low sodium heart healthy diet and 1200 ml fluid restriction. 5. Remain as active as possible.  PT in the home; then Cardiac Rehab.    Follow-up:   [facility]:  New Patient Visit 05/22/2018 at 11:40 a.m.    Patient and wife thanked me for the above information.    Army Melia, RN, BSN, [facility] Cardiovascular and Pulmonary Nurse Navigator

## 2018-05-16 NOTE — Discharge Summary (Signed)
[facility] [place] at [facility]   PATIENT NAME: Patrick Figueroa    MR#:  [id number]  DATE OF BIRTH:  1936/03/16  DATE OF ADMISSION:  05/14/2018 ADMITTING PHYSICIAN: Bertrum Sol, MD  DATE OF DISCHARGE: 05/16/2018  PRIMARY CARE PHYSICIAN: Marguarite Arbour, MD    ADMISSION DIAGNOSIS:  Hypoxia [R09.02] COPD exacerbation (HCC) [J44.1]  DISCHARGE DIAGNOSIS:  Acute on chronic congestive heart failure systolic Severe cardiomyopathy EF 20 to 25% SECONDARY DIAGNOSIS:   Past Medical History:  Diagnosis Date  . Arthritis    knee  . Asthma   . Cardiomyopathy (HCC)   . CHF (congestive heart failure) (HCC)   . COPD (chronic obstructive pulmonary disease) (HCC)    emphysema  . Environmental allergies   . Hypertension   . Presence of permanent cardiac pacemaker     HOSPITAL COURSE:  JimmieJeteris a81 y.o.malewith a known historyper below which also includes nonischemic cardiomyopathy with ejection fraction of 25%, seen by cardiology/Dr. Gwen Pounds last week and was told that he will need AICD placement in the future, presents the emergency room with 3 to 4-day history of shortness of breath, worsening swelling in his legs, nonproductive cough, shortness of breath worse with activity.  *Acute hypoxic respiratory failure due to acute on chronic systolic congestive heart failure exacerbation supplemental oxygen weaning as tolerated -IV Lasix 40 mg BID. UOP 4020cc -cardiology consultation with Dr. Juliann Pares -had recently and echo done as outpatient EF is 20 to 25%. He has history of non-dilated cardiomyopathy. -Also scheduled to have AICD placement at [facility] for evaluation by Dr. Gerre Pebbles EP. -I called [facility] cardiology and was told that patient will get a call in another 24 to 48 hours with the procedure information.  This was relayed to patient's wife. -he has history of pacemaker that was placed in 2009  *Acute on chronic systolic CHF - mild,NICM  EF25% Seen by cardiology last week/Dr. Gwen Pounds and was noted to need AICD placement in the future, status post pacer placement for complete heart block -aspirin, Coreg, Entresto, strict I&O monitoring, daily weights -dietitian saw  patient -pt has  been set up with cardiac rehab as well. -He has an appointment with CHF clinic   * COPD  Stable  * h/o PM  Overall feels a lot better Discharge plan discussed with patient. CHF home health nursing, CHF clinic appointment made We will discharge patient to home.      CONSULTS OBTAINED:  Treatment Team:  Alwyn Pea, MD Shane Crutch, MD  DRUG ALLERGIES:  No Known Allergies  DISCHARGE MEDICATIONS:   Allergies as of 05/16/2018   No Known Allergies     Medication List    TAKE these medications   acetaminophen 325 MG tablet Commonly known as:  TYLENOL Take 500 mg by mouth every 6 (six) hours as needed for mild pain.   aspirin 81 MG tablet Take 81 mg by mouth daily. AM   azelastine 0.05 % ophthalmic solution Commonly known as:  OPTIVAR Place 1 drop into both eyes 2 (two) times daily as needed (allergies).   benzonatate 100 MG capsule Commonly known as:  TESSALON PERLES Take 2 capsules (200 mg total) by mouth 3 (three) times daily.   carvedilol 12.5 MG tablet Commonly known as:  COREG Take 12.5 mg by mouth 2 (two) times daily with a meal.   COMBIVENT RESPIMAT 20-100 MCG/ACT Aers respimat Generic drug:  Ipratropium-Albuterol Inhale 1 puff into the lungs every 6 (six) hours.  ENTRESTO 24-26 MG Generic drug:  sacubitril-valsartan Take 1 tablet by mouth 2 (two) times daily.   fluticasone 50 MCG/ACT nasal spray Commonly known as:  FLONASE Place 1 spray into both nostrils daily. PM   Fluticasone-Salmeterol 250-50 MCG/DOSE Aepb Commonly known as:  ADVAIR Inhale 1 puff into the lungs 2 (two) times daily.   guaiFENesin 600 MG 12 hr tablet Commonly known as:  MUCINEX Take 600 mg by mouth 2 (two)  times daily as needed for to loosen phlegm.   levocetirizine 5 MG tablet Commonly known as:  XYZAL Take 5 mg by mouth every evening.   meloxicam 15 MG tablet Commonly known as:  MOBIC Take 15 mg by mouth daily as needed for pain.   montelukast 10 MG tablet Commonly known as:  SINGULAIR Take 10 mg by mouth every evening.   potassium chloride 10 MEQ tablet Commonly known as:  K-DUR,KLOR-CON Take 10 mEq by mouth daily. Mid day   torsemide 20 MG tablet Commonly known as:  DEMADEX Take 20 mg by mouth 2 (two) times daily.       If you experience worsening of your admission symptoms, develop shortness of breath, life threatening emergency, suicidal or homicidal thoughts you must seek medical attention immediately by calling 911 or calling your MD immediately  if symptoms less severe.  You Must read complete instructions/literature along with all the possible adverse reactions/side effects for all the Medicines you take and that have been prescribed to you. Take any new Medicines after you have completely understood and accept all the possible adverse reactions/side effects.   Please note  You were cared for by a hospitalist during your hospital stay. If you have any questions about your discharge medications or the care you received while you were in the hospital after you are discharged, you can call the unit and asked to speak with the hospitalist on call if the hospitalist that took care of you is not available. Once you are discharged, your primary care physician will handle any further medical issues. Please note that NO REFILLS for any discharge medications will be authorized once you are discharged, as it is imperative that you return to your primary care physician (or establish a relationship with a primary care physician if you do not have one) for your aftercare needs so that they can reassess your need for medications and monitor your lab values. Today   SUBJECTIVE   Overall  doing well  VITAL SIGNS:  Blood pressure (!) 116/101, pulse 84, temperature 98.7 F (37.1 C), temperature source Oral, resp. rate 18, height 5\' 9"  (1.753 m), weight 97.3 kg (214 lb 6.4 oz), SpO2 93 %.  I/O:    Intake/Output Summary (Last 24 hours) at 05/16/2018 1228 Last data filed at 05/16/2018 1129 Gross per 24 hour  Intake 480 ml  Output 2320 ml  Net -1840 ml    PHYSICAL EXAMINATION:  GENERAL:  82 y.o.-year-old patient lying in the bed with no acute distress.  EYES: Pupils equal, round, reactive to light and accommodation. No scleral icterus. Extraocular muscles intact.  HEENT: Head atraumatic, normocephalic. Oropharynx and nasopharynx clear.  NECK:  Supple, no jugular venous distention. No thyroid enlargement, no tenderness.  LUNGS: Normal breath sounds bilaterally, no wheezing, rales,rhonchi or crepitation. No use of accessory muscles of respiration.  CARDIOVASCULAR: S1, S2 normal. No murmurs, rubs, or gallops.  ABDOMEN: Soft, non-tender, non-distended. Bowel sounds present. No organomegaly or mass.  EXTREMITIES: No pedal edema, cyanosis, or clubbing.  NEUROLOGIC: Cranial  nerves II through XII are intact. Muscle strength 5/5 in all extremities. Sensation intact. Gait not checked.  PSYCHIATRIC: The patient is alert and oriented x 3.  SKIN: No obvious rash, lesion, or ulcer.   DATA REVIEW:   CBC  Recent Labs  Lab 05/14/18 1311  WBC 12.3*  HGB 10.8*  HCT 33.0*  PLT 381    Chemistries  Recent Labs  Lab 05/16/18 0310  NA 137  K 4.3  CL 105  CO2 24  GLUCOSE 187*  BUN 29*  CREATININE 0.91  CALCIUM 8.6*    Microbiology Results   No results found for this or any previous visit (from the past 240 hour(s)).  RADIOLOGY:  Dg Chest 2 View  Result Date: 05/14/2018 CLINICAL DATA:  Shortness of breath. EXAM: CHEST - 2 VIEW COMPARISON:  CT 04/19/2018. FINDINGS: Lateral view degraded by patient arm position. Midline trachea. Pacer with leads at right atrium and right  ventricle. No lead discontinuity. Borderline cardiomegaly, accentuated by AP portable frontal radiograph and low lung volumes. No definite pleural fluid. No pneumothorax. Pulmonary interstitial prominence is favored to be technique related. Lingular scarring. No new pulmonary consolidation. IMPRESSION: Borderline cardiomegaly and low lung volumes. No definite acute disease. Lingular scarring. Electronically Signed   By: Jeronimo Greaves M.D.   On: 05/14/2018 13:31     Management plans discussed with the patient, family and they are in agreement.  CODE STATUS:     Code Status Orders  (From admission, onward)        Start     Ordered   05/14/18 1633  Full code  Continuous     05/14/18 1633    Code Status History    This patient has a current code status but no historical code status.      TOTAL TIME TAKING CARE OF THIS PATIENT: *40* minutes.    Enedina Finner M.D on 05/16/2018 at 12:28 PM  Between 7am to 6pm - Pager - [phone] After 6pm go to [email or web address] - Social research officer, government  [facility]  Office  [phone]  CC: Primary care physician; Marguarite Arbour, MD

## 2018-05-16 NOTE — Progress Notes (Signed)
Overnight completed and placed on pt's chart

## 2018-05-16 NOTE — Progress Notes (Signed)
IV and tele removed. Discharge instructions given to pt and wife. Walker was delivered. Pt has no further concerns at this time.

## 2018-05-21 ENCOUNTER — Telehealth: Payer: Self-pay

## 2018-05-21 NOTE — Telephone Encounter (Signed)
Flagged on EMMI report for not reading discharge papers and having other questions or problems.  Called and spoke with patient.  He mentioned he has not read over his discharge papers, nor has his wife.  Per patient, discharge papers don't help him much as the hospital didn't treat his lung infection while here.  I encouraged him to read over them when he got a chance though as they had important information about follow up appointments.  He states he's been under the weather ever since Friday when he went to his PCP, who wrote him an antibiotic for his infection. Patient sees his PCP tomorrow.  Encouraged him to speak to his doctor about how he feels to see if there's anything they can do to help.  I thanked patient for his time and feedback.  Informed him that he would receive one more automated call as a final check-in.

## 2018-05-22 ENCOUNTER — Telehealth: Payer: Self-pay

## 2018-05-22 ENCOUNTER — Ambulatory Visit: Payer: Medicare Other | Admitting: Family

## 2018-05-22 NOTE — Telephone Encounter (Signed)
I spoke with Patrick Figueroa this morning who seemed very confused. He states that he has an appointment with Patrick Figueroa today at 2 pm. That he is treating him for a lung infection, "they didn't treat me properly in the hospital"   He is persistent that he spoke to Patrick Figueroa this morning who informed him that she would see him in his home on Thursday between 9:30 and 10:30. I explained that he may have the information confused as Patrick Figueroa does not see patients in their homes. I explained what the clinic does and how we are going to be helping his providers care for him in regards to his heart. He again began talking about how he has to see Patrick Figueroa, and I assured that we would not replace that doctor, however we want to make sure they have the correct information as I do not know who they spoke with about coming out to the house. "No one is looking at the records, and reviewing the information, I spoke to Patrick Figueroa and she is coming to the house". Attempted to reiterate that Patrick Figueroa does not go to the house.  Pt wants to cancel the appointment today with the CHF clinic and will talk to Patrick Figueroa before she "makes the wrong decisions"   I cancelled the appointment and will allow the patient to speak with Patrick Figueroa.

## 2018-06-25 HISTORY — PX: MEDIASTINOTOMY: SHX1011

## 2018-06-25 HISTORY — PX: BIV ICD GENERTAOR CHANGE OUT: SHX5745

## 2018-06-25 HISTORY — PX: PACEMAKER REMOVAL: SHX5066

## 2018-06-25 HISTORY — PX: CARDIAC DEFIBRILLATOR PLACEMENT: SHX171

## 2018-08-13 NOTE — Progress Notes (Signed)
[facility] [place] [facility]monary Medicine Consultation      Assessment and Plan:  COPD, with chronic bronchitis and excess mucus production. -Appears adequately controlled at this time, continue Advair, use combivent as needed.    Pneumonia.  -migratory infiltrates/pneumonia bilaterally. Discussed possible diagnoses, including organizing pneumonia vs. Atypical infection. Given the risks of bronchoscopy, would prefer not to go through with this.  We also discussed empiric prednisone however he is concerned about the effects on his weight, as he is currently trying to lose weight and is been trying to be more active.  He is now doing better, will continue to monitor with repeat CXR.    Heart failure with reduced ejection fraction. 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 -Patient will be high risk for any interventional procedures including bronchoscopy.  Orders Placed This Encounter  Procedures  . DG Chest 2 View     Return in about 6 months (around 02/14/2019).   Date: 08/13/2018  MRN# [id number] Patrick Figueroa. November 21, 1936    Patrick Figueroa. is a 82 y.o. old male seen in consultation for chief complaint of: possible infection   Chief Complaint  Patient presents with  . COPD    occasional cough white mucus and sob with exertion only.    HPI:   The patient is a 82 year old male with history of chronic systolic heart failure, status post pacemaker, dilated cardiomyopathy with ejection fraction of 25%. Since his last visit he feels that his breathing has improved. He is taking advair twice daily, combivent 4 x per day.   He previously had a cough, which is much better.   He is sleepy during the day, he has never been tested for sleep apnea. Their daughter has a diagnosis of OSA and is on CPAP.   **Desat walk 08/15/17; on rest on RA sat was 96% and HR 70. Walked 180 feet at moderate pace, sat was 95% ,HR 92 mild dyspnea.   **CT chest noncontrasted>>, 04/19/2018 in  comparison with 12/26/2017 and 07/17/2017; Bibasilar bronchocentric infiltrates have continued, improved in one area, advanced others.  Suspect atypical infection versus organizing pneumonia.  Medication:    Current Outpatient Medications:  .  acetaminophen (TYLENOL) 325 MG tablet, Take 500 mg by mouth every 6 (six) hours as needed for mild pain. , Disp: , Rfl:  .  aspirin 81 MG tablet, Take 81 mg by mouth daily. AM, Disp: , Rfl:  .  azelastine (OPTIVAR) 0.05 % ophthalmic solution, Place 1 drop into both eyes 2 (two) times daily as needed (allergies). , Disp: , Rfl:  .  benzonatate (TESSALON PERLES) 100 MG capsule, Take 2 capsules (200 mg total) by mouth 3 (three) times daily., Disp: 90 capsule, Rfl: 2 .  carvedilol (COREG) 12.5 MG tablet, Take 12.5 mg by mouth 2 (two) times daily with a meal., Disp: , Rfl:  .  ENTRESTO 24-26 MG, Take 1 tablet by mouth 2 (two) times daily. , Disp: , Rfl: 1 .  fluticasone (FLONASE) 50 MCG/ACT nasal spray, Place 1 spray into both nostrils daily. PM , Disp: , Rfl:  .  Fluticasone-Salmeterol (ADVAIR) 250-50 MCG/DOSE AEPB, Inhale 1 puff into the lungs 2 (two) times daily., Disp: , Rfl:  .  guaiFENesin (MUCINEX) 600 MG 12 hr tablet, Take 600 mg by mouth 2 (two) times daily as needed for to loosen phlegm. , Disp: , Rfl:  .  Ipratropium-Albuterol (COMBIVENT RESPIMAT) 20-100 MCG/ACT AERS respimat, Inhale 1 puff into the lungs every 6 (six) hours., Disp: ,  Rfl:  .  levocetirizine (XYZAL) 5 MG tablet, Take 5 mg by mouth every evening., Disp: , Rfl:  .  meloxicam (MOBIC) 15 MG tablet, Take 15 mg by mouth daily as needed for pain., Disp: , Rfl:  .  montelukast (SINGULAIR) 10 MG tablet, Take 10 mg by mouth every evening. , Disp: , Rfl:  .  potassium chloride (K-DUR,KLOR-CON) 10 MEQ tablet, Take 10 mEq by mouth daily. Mid day, Disp: , Rfl:  .  torsemide (DEMADEX) 20 MG tablet, Take 20 mg by mouth 2 (two) times daily., Disp: , Rfl:    Allergies:  Patient has no known  allergies.  Review of Systems:  Constitutional: Feels well. Cardiovascular: No chest pain.  Pulmonary: Denies dyspnea.   The remainder of systems were reviewed and were found to be negative other than what is documented in the HPI.    Physical Examination:   VS: BP 122/62 (BP Location: Left Arm, Cuff Size: Large)   Pulse 78   Resp 16   Ht 5\' 9"  (1.753 m)   Wt 215 lb (97.5 kg)   SpO2 100%   BMI 31.75 kg/m   General Appearance: No distress  Neuro:without focal findings, mental status, speech normal, alert and oriented HEENT: PERRLA, EOM intact Pulmonary: No wheezing, No rales  CardiovascularNormal S1,S2.  No m/r/g.  Abdomen: Benign, Soft, non-tender, No masses Renal:  No costovertebral tenderness  GU:  No performed at this time. Endoc: No evident thyromegaly, no signs of acromegaly or Cushing features Skin:   warm, no rashes, no ecchymosis  Extremities: normal, no cyanosis, clubbing.       LABORATORY PANEL:   CBC No results for input(s): WBC, HGB, HCT, PLT in the last 168 hours. ------------------------------------------------------------------------------------------------------------------  Chemistries  No results for input(s): NA, K, CL, CO2, GLUCOSE, BUN, CREATININE, CALCIUM, MG, AST, ALT, ALKPHOS, BILITOT in the last 168 hours.  Invalid input(s): GFRCGP ------------------------------------------------------------------------------------------------------------------  Cardiac Enzymes No results for input(s): TROPONINI in the last 168 hours. ------------------------------------------------------------  RADIOLOGY:  No results found.     Thank  you for the consultation and for allowing [facility] to assist in the care of your patient. Our recommendations are noted above.  Please contact us if we can be of further service.  Wells Guiles, M.D., F.C.C.P.  Board Certified in Internal Medicine, Pulmonary Medicine, Critical Care  Medicine, and Sleep Medicine.  [place] [facility] Office Number: [phone]   08/13/2018

## 2018-08-14 ENCOUNTER — Ambulatory Visit: Payer: Medicare Other | Admitting: Internal Medicine

## 2018-08-14 ENCOUNTER — Encounter: Payer: Self-pay | Admitting: Internal Medicine

## 2018-08-14 VITALS — BP 122/62 | HR 78 | Resp 16 | Ht 69.0 in | Wt 215.0 lb

## 2018-08-14 DIAGNOSIS — R911 Solitary pulmonary nodule: Secondary | ICD-10-CM | POA: Diagnosis not present

## 2018-08-14 DIAGNOSIS — J449 Chronic obstructive pulmonary disease, unspecified: Secondary | ICD-10-CM | POA: Diagnosis not present

## 2018-08-14 NOTE — Patient Instructions (Addendum)
Continue advair twice daily, use combivent only as needed.   Chest xray in 6 months and follow up after that.

## 2018-09-17 ENCOUNTER — Other Ambulatory Visit: Payer: Self-pay | Admitting: Internal Medicine

## 2019-01-22 ENCOUNTER — Other Ambulatory Visit: Payer: Self-pay

## 2019-01-22 DIAGNOSIS — R972 Elevated prostate specific antigen [PSA]: Secondary | ICD-10-CM

## 2019-01-23 ENCOUNTER — Other Ambulatory Visit: Payer: Medicare Other

## 2019-01-23 DIAGNOSIS — R972 Elevated prostate specific antigen [PSA]: Secondary | ICD-10-CM

## 2019-01-24 LAB — PSA: PROSTATE SPECIFIC AG, SERUM: 5.1 ng/mL — AB (ref 0.0–4.0)

## 2019-01-29 ENCOUNTER — Ambulatory Visit: Payer: Medicare Other | Admitting: Urology

## 2019-01-30 NOTE — Progress Notes (Signed)
error 

## 2019-01-31 ENCOUNTER — Encounter: Payer: Self-pay | Admitting: Urology

## 2019-01-31 ENCOUNTER — Ambulatory Visit (INDEPENDENT_AMBULATORY_CARE_PROVIDER_SITE_OTHER): Payer: Medicare Other | Admitting: Urology

## 2019-01-31 VITALS — BP 137/81 | HR 84 | Ht 69.0 in | Wt 216.0 lb

## 2019-01-31 DIAGNOSIS — R972 Elevated prostate specific antigen [PSA]: Secondary | ICD-10-CM

## 2019-01-31 NOTE — Progress Notes (Signed)
01/31/2019 9:20 AM   Patrick Figueroa. [birth year] [id number]  Referring provider: Marguarite Arbour, MD [address] [facility] [place], [place] [address]  Chief Complaint  Patient presents with  . Elevated PSA    HPI: Patrick Figueroa. is a 83 yo M who returns today for repeat PSA in the setting of slowly rising PSA.   His most recent PSA was 5.1 (01/23/2019) and has decreased from the previous years 5.3. The patient denies lower urinary tract symptoms and admits that he feels great. Patient would like to know if there is anything he can do to prevent prostate cancer.  He has no FHx of prostate cancer.   PSA trend 12/25/2014 4.01 07/08/2015 4.15 12/30/2015 4.66 06/28/2016 4.53 12/30/2016 4.75 06/29/2017 6.52 01/22/2018 5.3 01/23/2019 5.1   He does have multiple medical comorbidities including cardiomyopathy, CHF (EF 30%), and is status post pacemaker implant in 2019 for complete heart block.  He is on chronic aspirin.  PMH: Past Medical History:  Diagnosis Date  . Arthritis    knee  . Asthma   . Cardiomyopathy (HCC)   . CHF (congestive heart failure) (HCC)   . COPD (chronic obstructive pulmonary disease) (HCC)    emphysema  . Environmental allergies   . Hypertension   . Presence of permanent cardiac pacemaker     Surgical History: Past Surgical History:  Procedure Laterality Date  . CATARACT EXTRACTION W/PHACO Right 12/09/2015   Procedure: CATARACT EXTRACTION PHACO AND INTRAOCULAR LENS PLACEMENT (IOC);  Surgeon: Lockie Mola, MD;  Location: [facility];  Service: Ophthalmology;  Laterality: Right;  . CATARACT EXTRACTION W/PHACO Left 01/13/2016   Procedure: CATARACT EXTRACTION PHACO AND INTRAOCULAR LENS PLACEMENT (IOC);  Surgeon: Lockie Mola, MD;  Location: [facility];  Service: Ophthalmology;  Laterality: Left;  . COLONOSCOPY    . INSERT / REPLACE / REMOVE PACEMAKER  09/29/08    Home Medications:  Allergies as of  01/31/2019   No Known Allergies     Medication List       Accurate as of January 31, 2019  9:20 AM. Always use your most recent med list.        acetaminophen 325 MG tablet Commonly known as:  TYLENOL Take 500 mg by mouth every 6 (six) hours as needed for mild pain.   aspirin 81 MG tablet Take 81 mg by mouth daily. AM   azelastine 0.05 % ophthalmic solution Commonly known as:  OPTIVAR Place 1 drop into both eyes 2 (two) times daily as needed (allergies).   benzonatate 100 MG capsule Commonly known as:  TESSALON TAKE 2 CAPSULES BY MOUTH THREE TIMES DAILY   carvedilol 12.5 MG tablet Commonly known as:  COREG Take 12.5 mg by mouth 2 (two) times daily with a meal.   COMBIVENT RESPIMAT 20-100 MCG/ACT Aers respimat Generic drug:  Ipratropium-Albuterol Inhale 1 puff into the lungs every 6 (six) hours.   ENTRESTO 24-26 MG Generic drug:  sacubitril-valsartan Take 1 tablet by mouth 2 (two) times daily.   fluticasone 50 MCG/ACT nasal spray Commonly known as:  FLONASE Place 1 spray into both nostrils daily. PM   Fluticasone-Salmeterol 250-50 MCG/DOSE Aepb Commonly known as:  ADVAIR Inhale 1 puff into the lungs 2 (two) times daily.   guaiFENesin 600 MG 12 hr tablet Commonly known as:  MUCINEX Take 600 mg by mouth 2 (two) times daily as needed for to loosen phlegm.   levocetirizine 5 MG tablet Commonly known as:  XYZAL Take  5 mg by mouth every evening.   meloxicam 15 MG tablet Commonly known as:  MOBIC Take 15 mg by mouth daily as needed for pain.   montelukast 10 MG tablet Commonly known as:  SINGULAIR Take 10 mg by mouth every evening.   torsemide 20 MG tablet Commonly known as:  DEMADEX Take 20 mg by mouth 2 (two) times daily.       Allergies: No Known Allergies  Family History: Family History  Problem Relation Age of Onset  . Asthma Father   . Asthma Brother     Social History:  reports that he has never smoked. He has never used smokeless tobacco.  He reports that he does not drink alcohol or use drugs.  ROS: UROLOGY Frequent Urination?: No Hard to postpone urination?: No Burning/pain with urination?: No Get up at night to urinate?: No Leakage of urine?: No Urine stream starts and stops?: No Trouble starting stream?: No Do you have to strain to urinate?: No Blood in urine?: No Urinary tract infection?: No Sexually transmitted disease?: No Injury to kidneys or bladder?: No Painful intercourse?: No Weak stream?: No Erection problems?: No Penile pain?: No  Gastrointestinal Nausea?: No Vomiting?: No Indigestion/heartburn?: No Diarrhea?: No Constipation?: No  Constitutional Fever: No Night sweats?: No Weight loss?: No Fatigue?: No  Skin Skin rash/lesions?: No Itching?: No  Eyes Blurred vision?: No Double vision?: No  Ears/Nose/Throat Sore throat?: No Sinus problems?: No  Hematologic/Lymphatic Swollen glands?: No Easy bruising?: No  Cardiovascular Leg swelling?: No Chest pain?: No  Respiratory Cough?: Yes Shortness of breath?: Yes  Endocrine Excessive thirst?: No  Musculoskeletal Back pain?: No Joint pain?: No  Neurological Headaches?: No Dizziness?: No  Psychologic Depression?: No Anxiety?: No  Physical Exam: BP 137/81 (BP Location: Left Arm, Patient Position: Sitting)   Pulse 84   Ht 5\' 9"  (1.753 m)   Wt 216 lb (98 kg)   BMI 31.90 kg/m   Constitutional:  Alert and oriented, No acute distress. Respiratory: Normal respiratory effort, no increased work of breathing. GU: No CVA tenderness Skin: No rashes, bruises or suspicious lesions. Neurologic: Grossly intact, no focal deficits, moving all 4 extremities. Psychiatric: Normal mood and affect.  Laboratory Data:   PSA Trend  Prostate Specific Antigen, Serum Ref: 0.1 - 4 ng/mL  12/25/2014 4.01  07/08/2015 4.15  12/30/2015 4.66  06/28/2016 4.53  12/30/2016 4.75  06/29/2017 6.52  01/22/2018 5.3  01/23/2019 5.1   Assessment & Plan:     1. Elevated PSA   - PSA today is 5.1 down from 5.3 from previous year. This is reassuring.   - Patient reports that he feels great and has no urinary symptoms at this time. We discussed that given his age his PSA, while outside of normal range, is expected and given the trend does not present a concern at this time.  Return for Urinary symptoms.  Michiel Cowboy, PA-C  [facility] [address] [place], [place] [address] [phone]  I, Temidayo Atanda-Ogunleye , am acting as a scribe for [facility], PA-C  I have reviewed the above documentation for accuracy and completeness, and I agree with the above.    Michiel Cowboy, PA-C

## 2019-02-01 ENCOUNTER — Other Ambulatory Visit: Payer: Self-pay | Admitting: Internal Medicine

## 2019-02-01 DIAGNOSIS — R042 Hemoptysis: Secondary | ICD-10-CM

## 2019-02-12 NOTE — Progress Notes (Signed)
[facility] [place] [facility] Medicine Consultation      Assessment and Plan:  COPD with chronic bronchitis and excess mucus production. -Appears adequately controlled at this time. -Using Advair twice daily.  He is currently using Combivent 4 times daily, recommended he use only as needed.  Decrease the Combivent to twice daily and then go to as needed.  Pneumonia. -migratory infiltrates/pneumonia bilaterally. Symptomatically better.  --Repeat CT chest tomorrow.   Heart failure with reduced ejection fraction. 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 -Patient will be high risk for any interventional procedures including bronchoscopy.  Excessive daytime sleepiness.  --Recommend sleep study but patient declines.    Return in about 6 months (around 08/14/2019).   Date: 02/12/2019  MRN# [id number] Patrick Figueroa. December 30, 1935    Patrick Figueroa. is a 83 y.o. old male seen in consultation for chief complaint of: possible infection   Chief Complaint  Patient presents with  . Hemoptysis    pt scheduled  for CT scan tomorrow. He thinks cough better controlled since he changed the way he takes his fluid pill.    HPI:   The patient is a 83 year old male with history of chronic systolic heart failure, status post pacemaker, dilated cardiomyopathy with ejection fraction of 25%. Since his last visit he feels that his breathing has improved. He is taking advair twice daily, combivent 4 x per day.  Today he notes that he has been feeling better. He remains on diuretic 10 mg 4 times per day and feels that it has been helping tremendously. His cough is gone since starting on that regimen. He previously had some hemoptysis, which is now much resolved.  He is taking combivent 4 times per day, advair twice per day.   He is sleepy during the day, he has never been tested for sleep apnea. Their daughter has a diagnosis of OSA and is on CPAP. He is not sleepy during the day, he occasionally  naps during the day.   **Desat walk 08/15/17; on rest on RA sat was 96% and HR 70. Walked 180 feet at moderate pace, sat was 95% ,HR 92 mild dyspnea.   **CT chest noncontrasted>>, 04/19/2018 in comparison with 12/26/2017 and 07/17/2017; Bibasilar bronchocentric infiltrates have continued, improved in one area, advanced others.  Suspect atypical infection versus organizing pneumonia.  Medication:    Current Outpatient Medications:  .  acetaminophen (TYLENOL) 325 MG tablet, Take 500 mg by mouth every 6 (six) hours as needed for mild pain. , Disp: , Rfl:  .  aspirin 81 MG tablet, Take 81 mg by mouth daily. AM, Disp: , Rfl:  .  azelastine (OPTIVAR) 0.05 % ophthalmic solution, Place 1 drop into both eyes 2 (two) times daily as needed (allergies). , Disp: , Rfl:  .  benzonatate (TESSALON) 100 MG capsule, TAKE 2 CAPSULES BY MOUTH THREE TIMES DAILY (Patient not taking: Reported on 01/31/2019), Disp: 90 capsule, Rfl: 0 .  carvedilol (COREG) 12.5 MG tablet, Take 12.5 mg by mouth 2 (two) times daily with a meal., Disp: , Rfl:  .  ENTRESTO 24-26 MG, Take 1 tablet by mouth 2 (two) times daily. , Disp: , Rfl: 1 .  fluticasone (FLONASE) 50 MCG/ACT nasal spray, Place 1 spray into both nostrils daily. PM , Disp: , Rfl:  .  Fluticasone-Salmeterol (ADVAIR) 250-50 MCG/DOSE AEPB, Inhale 1 puff into the lungs 2 (two) times daily., Disp: , Rfl:  .  guaiFENesin (MUCINEX) 600 MG 12 hr tablet, Take 600 mg by mouth 2 (  two) times daily as needed for to loosen phlegm. , Disp: , Rfl:  .  Ipratropium-Albuterol (COMBIVENT RESPIMAT) 20-100 MCG/ACT AERS respimat, Inhale 1 puff into the lungs every 6 (six) hours., Disp: , Rfl:  .  levocetirizine (XYZAL) 5 MG tablet, Take 5 mg by mouth every evening., Disp: , Rfl:  .  meloxicam (MOBIC) 15 MG tablet, Take 15 mg by mouth daily as needed for pain., Disp: , Rfl:  .  montelukast (SINGULAIR) 10 MG tablet, Take 10 mg by mouth every evening. , Disp: , Rfl:  .  torsemide (DEMADEX) 20 MG tablet,  Take 20 mg by mouth 2 (two) times daily., Disp: , Rfl:    Allergies:  Patient has no known allergies.  Review of Systems:  Constitutional: Feels well. Cardiovascular: Denies chest pain, exertional chest pain.  Pulmonary: Denies hemoptysis, pleuritic chest pain.   The remainder of systems were reviewed and were found to be negative other than what is documented in the HPI.    Physical Examination:   VS: Resp 16   Ht 5\' 9"  (1.753 m)   Wt 216 lb (98 kg)   BMI 31.90 kg/m   General Appearance: No distress  Neuro:without focal findings, mental status, speech normal, alert and oriented HEENT: PERRLA, EOM intact Pulmonary: No wheezing, No rales  CardiovascularNormal S1,S2.  No m/r/g.  Abdomen: Benign, Soft, non-tender, No masses Renal:  No costovertebral tenderness  GU:  No performed at this time. Endoc: No evident thyromegaly, no signs of acromegaly or Cushing features Skin:   warm, no rashes, no ecchymosis  Extremities: normal, no cyanosis, clubbing.       LABORATORY PANEL:   CBC No results for input(s): WBC, HGB, HCT, PLT in the last 168 hours. ------------------------------------------------------------------------------------------------------------------  Chemistries  No results for input(s): NA, K, CL, CO2, GLUCOSE, BUN, CREATININE, CALCIUM, MG, AST, ALT, ALKPHOS, BILITOT in the last 168 hours.  Invalid input(s): GFRCGP ------------------------------------------------------------------------------------------------------------------  Cardiac Enzymes No results for input(s): TROPONINI in the last 168 hours. ------------------------------------------------------------  RADIOLOGY:  No results found.     Thank  you for the consultation and for allowing [facility] to assist in the care of your patient. Our recommendations are noted above.  Please contact us if we can be of further service.  Wells Guiles, M.D., F.C.C.P.  Board  Certified in Internal Medicine, Pulmonary Medicine, Critical Care Medicine, and Sleep Medicine.  [place] [facility] Office Number: [phone]   02/12/2019

## 2019-02-13 ENCOUNTER — Encounter: Payer: Self-pay | Admitting: Internal Medicine

## 2019-02-13 ENCOUNTER — Ambulatory Visit: Payer: Medicare Other | Admitting: Internal Medicine

## 2019-02-13 VITALS — Resp 16 | Ht 69.0 in | Wt 216.0 lb

## 2019-02-13 DIAGNOSIS — J449 Chronic obstructive pulmonary disease, unspecified: Secondary | ICD-10-CM

## 2019-02-13 DIAGNOSIS — J811 Chronic pulmonary edema: Secondary | ICD-10-CM | POA: Diagnosis not present

## 2019-02-13 DIAGNOSIS — R918 Other nonspecific abnormal finding of lung field: Secondary | ICD-10-CM | POA: Diagnosis not present

## 2019-02-13 DIAGNOSIS — J439 Emphysema, unspecified: Secondary | ICD-10-CM

## 2019-02-13 DIAGNOSIS — J181 Lobar pneumonia, unspecified organism: Secondary | ICD-10-CM

## 2019-02-13 NOTE — Patient Instructions (Signed)
Continue advair. Use combivent only when needed.  Continue water pill.

## 2019-02-14 ENCOUNTER — Ambulatory Visit
Admission: RE | Admit: 2019-02-14 | Discharge: 2019-02-14 | Disposition: A | Discharge status: Home / Self Care | Payer: Medicare Other | Source: Ambulatory Visit | Attending: Internal Medicine | Admitting: Internal Medicine

## 2019-02-14 DIAGNOSIS — R042 Hemoptysis: Secondary | ICD-10-CM | POA: Diagnosis not present

## 2019-03-28 DIAGNOSIS — J471 Bronchiectasis with (acute) exacerbation: Secondary | ICD-10-CM | POA: Insufficient documentation

## 2019-10-29 DIAGNOSIS — R972 Elevated prostate specific antigen [PSA]: Secondary | ICD-10-CM | POA: Insufficient documentation

## 2020-02-28 ENCOUNTER — Other Ambulatory Visit: Payer: Self-pay | Admitting: Internal Medicine

## 2020-02-28 DIAGNOSIS — R918 Other nonspecific abnormal finding of lung field: Secondary | ICD-10-CM

## 2020-03-11 ENCOUNTER — Other Ambulatory Visit: Payer: Self-pay

## 2020-03-11 ENCOUNTER — Ambulatory Visit
Admission: RE | Admit: 2020-03-11 | Discharge: 2020-03-11 | Disposition: A | Discharge status: Home / Self Care | Payer: Medicare Other | Source: Ambulatory Visit | Attending: Internal Medicine | Admitting: Internal Medicine

## 2020-03-11 DIAGNOSIS — R918 Other nonspecific abnormal finding of lung field: Secondary | ICD-10-CM | POA: Insufficient documentation

## 2021-03-01 DIAGNOSIS — I700 Atherosclerosis of aorta: Secondary | ICD-10-CM | POA: Insufficient documentation

## 2021-08-24 ENCOUNTER — Other Ambulatory Visit: Payer: Self-pay

## 2021-08-24 ENCOUNTER — Emergency Department: Payer: Medicare Other

## 2021-08-24 ENCOUNTER — Inpatient Hospital Stay
Admission: EM | Admit: 2021-08-24 | Discharge: 2021-08-27 | DRG: 166 | Disposition: A | Discharge status: Home / Self Care | Payer: Medicare Other | Attending: Internal Medicine | Admitting: Internal Medicine

## 2021-08-24 ENCOUNTER — Encounter: Payer: Self-pay | Admitting: Emergency Medicine

## 2021-08-24 DIAGNOSIS — R042 Hemoptysis: Secondary | ICD-10-CM | POA: Diagnosis present

## 2021-08-24 DIAGNOSIS — N179 Acute kidney failure, unspecified: Secondary | ICD-10-CM | POA: Diagnosis present

## 2021-08-24 DIAGNOSIS — Z791 Long term (current) use of non-steroidal anti-inflammatories (NSAID): Secondary | ICD-10-CM

## 2021-08-24 DIAGNOSIS — I5022 Chronic systolic (congestive) heart failure: Secondary | ICD-10-CM | POA: Diagnosis present

## 2021-08-24 DIAGNOSIS — J9811 Atelectasis: Secondary | ICD-10-CM | POA: Diagnosis present

## 2021-08-24 DIAGNOSIS — J189 Pneumonia, unspecified organism: Secondary | ICD-10-CM

## 2021-08-24 DIAGNOSIS — Z9841 Cataract extraction status, right eye: Secondary | ICD-10-CM | POA: Diagnosis not present

## 2021-08-24 DIAGNOSIS — Z961 Presence of intraocular lens: Secondary | ICD-10-CM | POA: Diagnosis present

## 2021-08-24 DIAGNOSIS — J4540 Moderate persistent asthma, uncomplicated: Secondary | ICD-10-CM | POA: Diagnosis present

## 2021-08-24 DIAGNOSIS — Z7982 Long term (current) use of aspirin: Secondary | ICD-10-CM

## 2021-08-24 DIAGNOSIS — I10 Essential (primary) hypertension: Secondary | ICD-10-CM | POA: Diagnosis not present

## 2021-08-24 DIAGNOSIS — Z20822 Contact with and (suspected) exposure to covid-19: Secondary | ICD-10-CM | POA: Diagnosis present

## 2021-08-24 DIAGNOSIS — I255 Ischemic cardiomyopathy: Secondary | ICD-10-CM | POA: Diagnosis present

## 2021-08-24 DIAGNOSIS — N1831 Chronic kidney disease, stage 3a: Secondary | ICD-10-CM | POA: Diagnosis present

## 2021-08-24 DIAGNOSIS — Z79899 Other long term (current) drug therapy: Secondary | ICD-10-CM

## 2021-08-24 DIAGNOSIS — J470 Bronchiectasis with acute lower respiratory infection: Secondary | ICD-10-CM | POA: Diagnosis present

## 2021-08-24 DIAGNOSIS — J439 Emphysema, unspecified: Secondary | ICD-10-CM | POA: Diagnosis present

## 2021-08-24 DIAGNOSIS — Z950 Presence of cardiac pacemaker: Secondary | ICD-10-CM

## 2021-08-24 DIAGNOSIS — Z8249 Family history of ischemic heart disease and other diseases of the circulatory system: Secondary | ICD-10-CM

## 2021-08-24 DIAGNOSIS — J9601 Acute respiratory failure with hypoxia: Secondary | ICD-10-CM | POA: Diagnosis present

## 2021-08-24 DIAGNOSIS — I130 Hypertensive heart and chronic kidney disease with heart failure and stage 1 through stage 4 chronic kidney disease, or unspecified chronic kidney disease: Secondary | ICD-10-CM | POA: Diagnosis present

## 2021-08-24 DIAGNOSIS — I442 Atrioventricular block, complete: Secondary | ICD-10-CM | POA: Diagnosis present

## 2021-08-24 DIAGNOSIS — J449 Chronic obstructive pulmonary disease, unspecified: Secondary | ICD-10-CM | POA: Diagnosis present

## 2021-08-24 DIAGNOSIS — J188 Other pneumonia, unspecified organism: Secondary | ICD-10-CM | POA: Diagnosis present

## 2021-08-24 DIAGNOSIS — J850 Gangrene and necrosis of lung: Secondary | ICD-10-CM | POA: Diagnosis present

## 2021-08-24 DIAGNOSIS — Z9842 Cataract extraction status, left eye: Secondary | ICD-10-CM

## 2021-08-24 DIAGNOSIS — Z825 Family history of asthma and other chronic lower respiratory diseases: Secondary | ICD-10-CM | POA: Diagnosis not present

## 2021-08-24 LAB — EXPECTORATED SPUTUM ASSESSMENT W GRAM STAIN, RFLX TO RESP C

## 2021-08-24 LAB — TROPONIN I (HIGH SENSITIVITY): Troponin I (High Sensitivity): 6 ng/L (ref <18)

## 2021-08-24 LAB — RESP PANEL BY RT-PCR (FLU A&B, COVID) ARPGX2
Influenza A by PCR: NEGATIVE
Influenza B by PCR: NEGATIVE
SARS Coronavirus 2 by RT PCR: NEGATIVE

## 2021-08-24 LAB — BASIC METABOLIC PANEL
Anion gap: 10 (ref 5–15)
BUN: 29 mg/dL — ABNORMAL HIGH (ref 8–23)
CO2: 24 mmol/L (ref 22–32)
Calcium: 8.6 mg/dL — ABNORMAL LOW (ref 8.9–10.3)
Chloride: 97 mmol/L — ABNORMAL LOW (ref 98–111)
Creatinine, Ser: 1.75 mg/dL — ABNORMAL HIGH (ref 0.61–1.24)
GFR, Estimated: 38 mL/min — ABNORMAL LOW (ref >60)
Glucose, Bld: 132 mg/dL — ABNORMAL HIGH (ref 70–99)
Potassium: 4.3 mmol/L (ref 3.5–5.1)
Sodium: 131 mmol/L — ABNORMAL LOW (ref 135–145)

## 2021-08-24 LAB — CBC
HCT: 35.4 % — ABNORMAL LOW (ref 39.0–52.0)
Hemoglobin: 12 g/dL — ABNORMAL LOW (ref 13.0–17.0)
MCH: 27.3 pg (ref 26.0–34.0)
MCHC: 33.9 g/dL (ref 30.0–36.0)
MCV: 80.5 fL (ref 80.0–100.0)
Platelets: 305 10*3/uL (ref 150–400)
RBC: 4.4 MIL/uL (ref 4.22–5.81)
RDW: 15.9 % — ABNORMAL HIGH (ref 11.5–15.5)
WBC: 14 10*3/uL — ABNORMAL HIGH (ref 4.0–10.5)
nRBC: 0 % (ref 0.0–0.2)

## 2021-08-24 LAB — PROCALCITONIN: Procalcitonin: <0.1 ng/mL

## 2021-08-24 LAB — MRSA NEXT GEN BY PCR, NASAL: MRSA by PCR Next Gen: NOT DETECTED

## 2021-08-24 LAB — SEDIMENTATION RATE: Sed Rate: 85 mm/hr — ABNORMAL HIGH (ref 0–20)

## 2021-08-24 MED ORDER — VANCOMYCIN HCL IN DEXTROSE 1-5 GM/200ML-% IV SOLN
1000.0000 mg | Freq: Once | INTRAVENOUS | Status: AC
  Administered 2021-08-24: 1000 mg via INTRAVENOUS
  Filled 2021-08-24: qty 200

## 2021-08-24 MED ORDER — ONDANSETRON HCL 4 MG PO TABS: 4.0000 mg | ORAL_TABLET | Freq: Four times a day (QID) | ORAL | Status: DC | PRN

## 2021-08-24 MED ORDER — VANCOMYCIN HCL IN DEXTROSE 1-5 GM/200ML-% IV SOLN
1000.0000 mg | INTRAVENOUS | Status: DC
  Administered 2021-08-24: 1000 mg via INTRAVENOUS
  Filled 2021-08-24 (×2): qty 200

## 2021-08-24 MED ORDER — BENZONATATE 100 MG PO CAPS
200.0000 mg | ORAL_CAPSULE | Freq: Three times a day (TID) | ORAL | Status: DC
  Administered 2021-08-24 – 2021-08-27 (×8): 200 mg via ORAL
  Filled 2021-08-24 (×10): qty 2

## 2021-08-24 MED ORDER — INFLUENZA VAC A&B SA ADJ QUAD 0.5 ML IM PRSY
0.5000 mL | PREFILLED_SYRINGE | INTRAMUSCULAR | Status: DC | PRN
  Filled 2021-08-24: qty 0.5

## 2021-08-24 MED ORDER — PIPERACILLIN-TAZOBACTAM 3.375 G IVPB 30 MIN
3.3750 g | Freq: Once | INTRAVENOUS | Status: AC
  Administered 2021-08-24: 3.375 g via INTRAVENOUS
  Filled 2021-08-24: qty 50

## 2021-08-24 MED ORDER — CARVEDILOL 12.5 MG PO TABS
12.5000 mg | ORAL_TABLET | Freq: Two times a day (BID) | ORAL | Status: DC
  Administered 2021-08-25 – 2021-08-27 (×4): 12.5 mg via ORAL
  Filled 2021-08-24 (×5): qty 1

## 2021-08-24 MED ORDER — PIPERACILLIN-TAZOBACTAM 3.375 G IVPB
3.3750 g | Freq: Three times a day (TID) | INTRAVENOUS | Status: DC
  Administered 2021-08-25 – 2021-08-27 (×7): 3.375 g via INTRAVENOUS
  Filled 2021-08-24 (×7): qty 50

## 2021-08-24 MED ORDER — MONTELUKAST SODIUM 10 MG PO TABS
10.0000 mg | ORAL_TABLET | Freq: Every evening | ORAL | Status: DC
  Administered 2021-08-24 – 2021-08-26 (×3): 10 mg via ORAL
  Filled 2021-08-24 (×3): qty 1

## 2021-08-24 MED ORDER — GUAIFENESIN ER 600 MG PO TB12: 600.0000 mg | ORAL_TABLET | Freq: Two times a day (BID) | ORAL | Status: DC | PRN

## 2021-08-24 MED ORDER — ACETAMINOPHEN 325 MG PO TABS: 650.0000 mg | ORAL_TABLET | Freq: Four times a day (QID) | ORAL | Status: DC | PRN

## 2021-08-24 MED ORDER — KETOTIFEN FUMARATE 0.025 % OP SOLN
1.0000 [drp] | Freq: Two times a day (BID) | OPHTHALMIC | Status: DC
  Administered 2021-08-24 – 2021-08-27 (×6): 1 [drp] via OPHTHALMIC
  Filled 2021-08-24: qty 5

## 2021-08-24 MED ORDER — SODIUM CHLORIDE 0.9 % IV BOLUS
500.0000 mL | Freq: Once | INTRAVENOUS | Status: AC
  Administered 2021-08-24: 500 mL via INTRAVENOUS

## 2021-08-24 MED ORDER — ASPIRIN EC 81 MG PO TBEC
81.0000 mg | DELAYED_RELEASE_TABLET | Freq: Every day | ORAL | Status: DC
  Administered 2021-08-24 – 2021-08-27 (×3): 81 mg via ORAL
  Filled 2021-08-24 (×4): qty 1

## 2021-08-24 MED ORDER — PREDNISONE 20 MG PO TABS
10.0000 mg | ORAL_TABLET | Freq: Every day | ORAL | Status: DC
  Administered 2021-08-25 – 2021-08-27 (×2): 10 mg via ORAL
  Filled 2021-08-24 (×3): qty 1

## 2021-08-24 MED ORDER — ENOXAPARIN SODIUM 40 MG/0.4ML IJ SOSY
40.0000 mg | PREFILLED_SYRINGE | INTRAMUSCULAR | Status: DC
  Administered 2021-08-24 – 2021-08-26 (×3): 40 mg via SUBCUTANEOUS
  Filled 2021-08-24 (×3): qty 0.4

## 2021-08-24 MED ORDER — LORATADINE 10 MG PO TABS
10.0000 mg | ORAL_TABLET | Freq: Every evening | ORAL | Status: DC
  Administered 2021-08-25 – 2021-08-26 (×2): 10 mg via ORAL
  Filled 2021-08-24 (×2): qty 1

## 2021-08-24 MED ORDER — ACETAMINOPHEN 650 MG RE SUPP: 650.0000 mg | Freq: Four times a day (QID) | RECTAL | Status: DC | PRN

## 2021-08-24 MED ORDER — ONDANSETRON HCL 4 MG/2ML IJ SOLN: 4.0000 mg | Freq: Four times a day (QID) | INTRAMUSCULAR | Status: DC | PRN

## 2021-08-24 NOTE — Consult Note (Signed)
Pulmonary Medicine          Date: 08/24/2021,   MRN# [id number] Patrick Figueroa. [birth year]     AdmissionWeight: 88.9 kg                 CurrentWeight: 88.9 kg   Reffering physician : Dr Jacqualine Code   CHIEF COMPLAINT:   Non-massive hemoptysis with right upper lobe necrotizing pneumonia and failure of outpatient therapy.   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  He was seen for acute onset respiratory distress with productive cough 08/11/21 and was prescribed antibiotics and prednisone after CXR done in clinic revealed cavitary lesion of RUL with signs of necrotizing pneumonia.  He did complete antibiotic and prednisone burst but did not imrprove with complaints of ongoing hemoptysis and sever dyspnea with cough. He came in to ER due to failure of outpatient therapy and in ED had preliminary blood work with leukocytosis, AKI with reduced GFR at 38.  He had CT chest done showing extensive consolidated infiltrate of RUL with cavitary component suggestive of ongoing pneumonia. PCCM consultation placed for additional evaluation and management.    PAST MEDICAL HISTORY   Past Medical History:  Diagnosis Date  . Arthritis    knee  . Asthma   . Cardiomyopathy ([place])   . CHF (congestive heart failure) ([place])   . COPD (chronic obstructive pulmonary disease) ([facility])    emphysema  . Environmental allergies   . Hypertension   . Presence of permanent cardiac pacemaker      SURGICAL HISTORY   Past Surgical History:  Procedure Laterality Date  . CATARACT EXTRACTION W/PHACO Right 12/09/2015   Procedure: CATARACT EXTRACTION PHACO AND INTRAOCULAR LENS PLACEMENT ([place]);  Surgeon: Leandrew Koyanagi, MD;  Location: [place];  Service: Ophthalmology;  Laterality: Right;  . CATARACT EXTRACTION W/PHACO Left 01/13/2016   Procedure: CATARACT EXTRACTION PHACO AND INTRAOCULAR LENS PLACEMENT ([facility]);  Surgeon: Leandrew Koyanagi, MD;  Location: [place];  Service: Ophthalmology;  Laterality: Left;  . COLONOSCOPY    . INSERT / REPLACE / REMOVE PACEMAKER  09/29/08     FAMILY HISTORY   Family History  Problem Relation Age of Onset  . Asthma Father   . Asthma Brother      SOCIAL HISTORY   Social History   Tobacco Use  . Smoking status: Never  . Smokeless tobacco: Never  Vaping Use  . Vaping Use: Never used  Substance Use Topics  . Alcohol use: No  . Drug use: No     MEDICATIONS    Home Medication:  Current Outpatient Rx  . Order #: [id number] Class: Historical Med  . Order #: [id number] Class: Historical Med  . Order #: [id number] Class: Historical Med  . Order #: [id number] Class: Historical Med  . Order #: [id number] Class: Historical Med  . Order #: [id number] Class: Historical Med  . Order #: [id number] Class: Historical Med  . Order #: [id number] Class: Historical Med  . Order #: [id number] Class: Historical Med  . Order #: [id number] Class: Historical Med  . Order #: [id number] Class: Historical Med  . Order #: [id number] Class: Historical Med  . Order #: [id number] Class: Historical Med    Current Medication:  Current Facility-Administered Medications:  .  piperacillin-tazobactam (ZOSYN) IVPB 3.375 g,  3.375 g, Intravenous, Once, Wynelle Cleveland, RPH .  sodium chloride 0.9 % bolus 500 mL, 500 mL, Intravenous, Once, Delman Kitten, MD .  vancomycin (VANCOCIN) IVPB 1000 mg/200 mL premix, 1,000 mg, Intravenous, Once, Wynelle Cleveland, [facility]  Current Outpatient Medications:  .  acetaminophen (TYLENOL) 325 MG tablet, Take 500 mg by mouth every 6 (six) hours as needed for mild pain. , Disp: , Rfl:  .  aspirin 81 MG tablet, Take 81 mg by mouth daily. AM, Disp: , Rfl:  .  azelastine (OPTIVAR) 0.05 %  ophthalmic solution, Place 1 drop into both eyes 2 (two) times daily as needed (allergies). , Disp: , Rfl:  .  carvedilol (COREG) 12.5 MG tablet, Take 12.5 mg by mouth 2 (two) times daily with a meal., Disp: , Rfl:  .  ENTRESTO 24-26 MG, Take 1 tablet by mouth 2 (two) times daily. , Disp: , Rfl: 1 .  fluticasone (FLONASE) 50 MCG/ACT nasal spray, Place 1 spray into both nostrils daily. PM , Disp: , Rfl:  .  Fluticasone-Salmeterol (ADVAIR) 250-50 MCG/DOSE AEPB, Inhale 1 puff into the lungs 2 (two) times daily., Disp: , Rfl:  .  guaiFENesin (MUCINEX) 600 MG 12 hr tablet, Take 600 mg by mouth 2 (two) times daily as needed for to loosen phlegm. , Disp: , Rfl:  .  Ipratropium-Albuterol (COMBIVENT RESPIMAT) 20-100 MCG/ACT AERS respimat, Inhale 1 puff into the lungs every 6 (six) hours., Disp: , Rfl:  .  levocetirizine (XYZAL) 5 MG tablet, Take 5 mg by mouth every evening., Disp: , Rfl:  .  meloxicam (MOBIC) 15 MG tablet, Take 15 mg by mouth daily as needed for pain., Disp: , Rfl:  .  montelukast (SINGULAIR) 10 MG tablet, Take 10 mg by mouth every evening. , Disp: , Rfl:  .  torsemide (DEMADEX) 20 MG tablet, Take 20 mg by mouth 2 (two) times daily. Patient takes 10 mg qid, Disp: , Rfl:     ALLERGIES   Patient has no known allergies.     REVIEW OF SYSTEMS    Review of Systems:  Gen:  Denies  fever, sweats, chills weigh loss  HEENT: Denies blurred vision, double vision, ear pain, eye pain, hearing loss, nose bleeds, sore throat Cardiac:  No dizziness, chest pain or heaviness, chest tightness,edema Resp:   Denies cough or sputum porduction, shortness of breath,wheezing, hemoptysis,  Gi: Denies swallowing difficulty, stomach pain, nausea or vomiting, diarrhea, constipation, bowel incontinence Gu:  Denies bladder incontinence, burning urine Ext:   Denies Joint pain, stiffness or swelling Skin: Denies  skin rash, easy bruising or bleeding or hives Endoc:  Denies polyuria, polydipsia , polyphagia  or weight change Psych:   Denies depression, insomnia or hallucinations   Other:  All other systems negative   VS: BP (!) 150/137 (BP Location: Left Arm)   Pulse 96   Temp 98.2 F (36.8 C) (Oral)   Resp 18   Ht $R'5\' 9"'zn$  (1.753 m)   Wt 88.9 kg   SpO2 96%   BMI 28.94 kg/m      PHYSICAL EXAM    GENERAL:NAD, no fevers, chills, no weakness no fatigue HEAD: Normocephalic, atraumatic.  EYES: Pupils equal, round, reactive to light. Extraocular muscles intact. No scleral icterus.  MOUTH: Moist mucosal membrane. Dentition intact. No abscess noted.  EAR, NOSE, THROAT: Clear without exudates. No external lesions.  NECK: Supple. No thyromegaly. No nodules. No JVD.  PULMONARY: rhonchi at right upper lung zone CARDIOVASCULAR: S1 and S2. Regular  rate and rhythm. No murmurs, rubs, or gallops. No edema. Pedal pulses 2+ bilaterally.  GASTROINTESTINAL: Soft, nontender, nondistended. No masses. Positive bowel sounds. No hepatosplenomegaly.  MUSCULOSKELETAL: No swelling, clubbing, or edema. Range of motion full in all extremities.  NEUROLOGIC: Cranial nerves II through XII are intact. No gross focal neurological deficits. Sensation intact. Reflexes intact.  SKIN: No ulceration, lesions, rashes, or cyanosis. Skin warm and dry. Turgor intact.  PSYCHIATRIC: Mood, affect within normal limits. The patient is awake, alert and oriented x 3. Insight, judgment intact.       IMAGING    DG Chest 2 View  Result Date: 08/24/2021 CLINICAL DATA:  Chest pain EXAM: CHEST - 2 VIEW COMPARISON:  08/12/2021 FINDINGS: Left chest wall ICD noted with leads in the right atrial appendage, coronary sinus and right ventricle. Heart size appears normal. No pleural effusion identified. Right upper lobe cavitary process is identified measuring approximately 7.4 x 7.6 cm. Left lung appears clear. Visualized osseous structures appear intact. IMPRESSION: Right upper lobe cavitary process is identified. Findings may reflect  cavitating pneumonia versus underlying malignancy. Suggest further evaluation with CT of the chest. Electronically Signed   By: Kerby Moors M.D.   On: 08/24/2021 17:42      ASSESSMENT/PLAN   Acute hypoxemic respiratory failure - present on admission  - COVID19 negative   - will perform infectious workup for pneumonia -serum fungitell -legionella ab -strep pneumoniae ur AG -Histoplasma Ur Ag -sputum resp cultures -AFB sputum expectorated specimen -sputum cytology  -reviewed pertinent imaging with patient today - ESR, CRP -PT/OT  -may need bronchoscopic evaluation will discuss further with patient and wife  Non-massive hemoptysis    - cough suppresion with tessalon     - patient does not tolerate narcotics   Moderate persistent asthma   - albuterol  neb q6h and prednisone 10 mg for now   Chronic cylindrical Bronchiectasis  -Patient empirically on Zosyn and Vancomycin for pna  Chronic renal failure KDIGO 3   - judicious use of IVF    - reduction of non-essential nephrotoxins      -please encourage patient to use incentive spirometer few times each hour while hospitalized.        Thank you for allowing me to participate in the care of this patient.  Total face to face encounter time for this patient visit was >45 min. >50% of the time was  spent in counseling and coordination of care.   Patient/Family are satisfied with care plan and all questions have been answered.  This document was prepared using Dragon voice recognition software and may include unintentional dictation errors.     Ottie Glazier, M.D.  Division of [facility]

## 2021-08-24 NOTE — ED Notes (Signed)
Alaina RN aware of assigned bed °

## 2021-08-24 NOTE — H&P (Signed)
History and Physical  Patient Name: Patrick Figueroa.     HCW:[id number]    DOB: Apr 22, 1936    DOA: 08/24/2021 PCP: Marguarite Arbour, MD  Patient coming from: Home to pulmonologist office to hospital  Chief Complaint: Cough, hemoptysis      HPI: Patrick Figueroa. is a 85 y.o. M with hx of COPD, FEV1 24%, chronic bronchiectasis, complete heart block with PPM and ICD, and sCHF EF 40% who presents with 1 month fatigue, cough.  Patient developed fatigue about 1 month ago, subsequently cough, hemoptysis, night sweats.  He was treated with amoxicillin, followed by a course of azithromycin and prednisone without improvement.  Today he went to his pulmonologist who obtained a chest x-ray that showed a cavitary pneumonia and so he was referred to the ER.  In the ER, afebrile, hemodynamically stable.  WBC 14 K, creatinine 1.75 from baseline 1.2.  He was started on vancomycin and Zosyn and the hospitalist service were asked to admit for pneumonia failed outpatient treatment.      ROS: Review of Systems  Constitutional:  Positive for diaphoresis, fever and malaise/fatigue.  Respiratory:  Positive for cough, hemoptysis, sputum production and wheezing. Negative for shortness of breath.   Cardiovascular:  Negative for chest pain, palpitations, orthopnea and leg swelling.  All other systems reviewed and are negative.        Past Medical History:  Diagnosis Date   Arthritis    knee   Asthma    Cardiomyopathy ([facility])    CHF (congestive heart failure) ([facility])    COPD (chronic obstructive pulmonary disease) ([facility])    emphysema   Environmental allergies    Hypertension    Presence of permanent cardiac pacemaker     Past Surgical History:  Procedure Laterality Date   CATARACT EXTRACTION W/PHACO Right 12/09/2015   Procedure: CATARACT EXTRACTION PHACO AND INTRAOCULAR LENS PLACEMENT ([facility]);  Surgeon: Lockie Mola, MD;  [facility];  Service: Ophthalmology;  Laterality: Right;    CATARACT EXTRACTION W/PHACO Left 01/13/2016   Procedure: CATARACT EXTRACTION PHACO AND INTRAOCULAR LENS PLACEMENT ([facility]);  Surgeon: Lockie Mola, MD;  [facility];  Service: Ophthalmology;  Laterality: Left;   COLONOSCOPY     INSERT / REPLACE / REMOVE PACEMAKER  09/29/08    Social History: Patient lives with his wife.  He is retired from Psychiatrist.  The patient walks with a  cane sometimes, still drives.  Nonsmoker.  No Known Allergies  Family history: family history includes Asthma in his brother and father; Heart attack in his sister; Rheum arthritis in an other family member.  Prior to Admission medications   Medication Sig Start Date End Date Taking? Authorizing Provider  acetaminophen w ith h(TYLENOL) 325 MG tablet Take 500 mg by mouth every 6 (six) hours as needed for mild pain.     [provider]  aspirin 81 MG tablet Take 81 mg by mouth daily. AM    [provider]  azelastine (OPTIVAR) 0.05 % ophthalmic solution Place 1 drop into both eyes 2 (two) times daily as needed (allergies).     [provider]  carvedilol (COREG) 12.5 MG tablet Take 12.5 mg by mouth 2 (two) times daily with a meal.    [provider]  ENTRESTO 24-26 MG Take 1 tablet by mouth 2 (two) times daily.  12/29/17   [provider]  fluticasone (FLONASE) 50 MCG/ACT nasal spray Place 1 spray into both nostrils daily. PM  [provider]  Fluticasone-Salmeterol (ADVAIR) 250-50 MCG/DOSE AEPB Inhale 1 puff into the lungs 2 (two) times daily.    [provider]  guaiFENesin (MUCINEX) 600 MG 12 hr tablet Take 600 mg by mouth 2 (two) times daily as needed for to loosen phlegm.     [provider]  Ipratropium-Albuterol (COMBIVENT RESPIMAT) 20-100 MCG/ACT AERS respimat Inhale 1 puff into the lungs every 6 (six) hours.    [provider]  levocetirizine (XYZAL) 5 MG tablet Take 5 mg by mouth every evening.     [provider]  meloxicam (MOBIC) 15 MG tablet Take 15 mg by mouth daily as needed for pain.    [provider]  montelukast (SINGULAIR) 10 MG tablet Take 10 mg by mouth every evening.     [provider]  torsemide (DEMADEX) 20 MG tablet Take 20 mg by mouth 2 (two) times daily. Patient takes 10 mg qid 04/17/18 04/17/19  [provider]       Physical Exam: BP (!) 150/137 (BP Location: Left Arm)   Pulse 96   Temp 98.2 F (36.8 C) (Oral)   Resp 18   Ht 5\' 9"  (1.753 m)   Wt 88.9 kg   SpO2 96%   BMI 28.94 kg/m  General appearance: Well-developed, adult male, alert and in no acute distress.   Eyes: Anicteric, conjunctiva pink, lids and lashes normal. PERRL.    ENT: No nasal deformity, discharge, epistaxis.  Hearing normal. OP moist without lesions.   Neck: No neck masses.  Trachea midline.  No thyromegaly/tenderness. Lymph: No cervical or supraclavicular lymphadenopathy. Skin: Warm and dry.  No jaundice.  No suspicious rashes or lesions. Cardiac: RRR, nl S1-S2, no murmurs appreciated.  Capillary refill is brisk.  JVP normal.  No LE edema.  Radial pulses 2+ and symmetric. Respiratory: Normal respiratory rate and rhythm.  Wheezing in anterior fields.  Prolonged inspiratory and expieratory phase in back, no rales.    Abdomen: Abdomen soft.  No TTP or guarding. No ascites, distension, hepatosplenomegaly.   MSK: No deformities or effusions of the large joints of the upper or lower extremities bilaterally.  No cyanosis or clubbing. Neuro: Cranial nerves normal.  Sensation intact to light touch. Speech is fluent.  Muscle strength 5/5 and symmetric.    Psych: Sensorium intact and responding to questions, attention normal.  Behavior appropriate.  Affect approrpiate.  Judgment and insight appear normal.     Labs on Admission:  I have personally reviewed following labs and imaging studies: CBC: Recent Labs  Lab 08/24/21 1638  WBC 14.0*  HGB 12.0*  HCT  35.4*  MCV 80.5  PLT 305   Basic Metabolic Panel: Recent Labs  Lab 08/24/21 1638  NA 131*  K 4.3  CL 97*  CO2 24  GLUCOSE 132*  BUN 29*  CREATININE 1.75*  CALCIUM 8.6*   GFR: Estimated Creatinine Clearance: 35.9 mL/min (A) (by C-G formula based on SCr of 1.75 mg/dL (H)).          Radiological Exams on Admission: Personally reviewed CXR showed a RUL opacity: DG Chest 2 View  Result Date: 08/24/2021 CLINICAL DATA:  Chest pain EXAM: CHEST - 2 VIEW COMPARISON:  08/12/2021 FINDINGS: Left chest wall ICD noted with leads in the right atrial appendage, coronary sinus and right ventricle. Heart size appears normal. No pleural effusion identified. Right upper lobe cavitary process is identified measuring approximately 7.4 x 7.6 cm. Left lung appears clear. Visualized osseous structures appear intact. IMPRESSION:  Right upper lobe cavitary process is identified. Findings may reflect cavitating pneumonia versus underlying malignancy. Suggest further evaluation with CT of the chest. Electronically Signed   By: Signa Kell M.D.   On: 08/24/2021 17:42    EKG: Independently reviewed. Paced rhythm.       Assessment/Plan   Non-resolving pneumonia  Patient with persistent pneumonia, failed outpatient treatment with appropriate antibiotics.  Patient meets SIRS criteria but clinically is not septic, sepsis ruled out at admission.  - Start vancomycin and Zosyn - Continue prednisone 10 mg daily - Consult pulmonology, appreciate recommendations  -MRSA nares de-escalate vancomycin if able - Obtain sputum bacterial culture - Sputum fungal culture - Obtain AFB and morning AFB tomorrow - Obtain Fungitell - Obtain cryptococcal antigen - Check Aspergillus antigen and serum - Check Legionella urine antigen    Acute kidney injury Baseline Cr 1.1-1.2.  He received 1.75.  Given IV fluids in the ER - Hold Entresto, Mobic, diuretics/torsemide - Trend BMP - Check FeNA and UA  COPD No  evidence of flare - Hold home Advair - Give nebulized ICS/LABA - Continue Singulair  Complete heart block Has history of pacemaker and ICD  Chronic systolic CHF Ischemic cardiomyopathy Appears euvolemic, no evidence of flare - Continue home aspirin - Continue Coreg - Hold Entresto, diuretic for 1 day given renal dysfunction      DVT prophylaxis: Lovenox  Code Status: FULL  Family Communication: Wife at bedside, code status confirmed  Disposition Plan: Anticipate IV antibiotics, Pulmonlogy consultation, follow up studies and response to treatment Consults called: Pulmonology Admission status: INPATIENT   At the time of admission, it appears that the appropriate admission status for this patient is INPATIENT. This is judged to be reasonable and necessary in order to provide the required intensity of service to ensure the patient's safety given: -presenting symptoms of cough, hyemoptysis, fatigue -initial radiographic and laboratory data leukocytosis, cavitary pneumonia on CXR, failed outpatient therapy -in the context of their chronic comorbidities CHF    Together, these circumstances are felt to place him at high risk for further clinical deterioration threatening life, limb, or organ requiring a high intensity of service due to this acute illness that poses a threat to life, limb or bodily function.  I certify that at the point of admission it is my clinical judgment that the patient will require inpatient hospital care spanning beyond 2 midnights from the point of admission and that early discharge would result in unnecessary risk of decompensation and readmission or threat to life, limb or bodily function.   Medical decision making: Patient seen at 6:16 PM on 08/24/2021.  The patient was discussed with Dr. Fanny Bien.  What exists of the patient's chart was reviewed in depth and summarized above.  Clinical condition: stable.        Earl Lites Arne Schlender [facility] Please page though AMION or Epic secure chat:  For password, contact charge nurse

## 2021-08-24 NOTE — ED Provider Notes (Signed)
[facility] Emergency Department Provider Note   ____________________________________________   Event Date/Time   First MD Initiated Contact with Patient 08/24/21 1731     (approximate)  I have reviewed the triage vital signs and the nursing notes.   HISTORY  Chief Complaint Pneumonia    HPI Patrick Figueroa. is a 85 y.o. male history of cardiomyopathy CHF COPD treatment for recent pneumonia on steroid  Patient reports has had some coughing up of small amounts of blood that almost looks like "liver" over the last week it is slightly improved.  However he continues to have some fatigue weakness and mild ongoing hemoptysis.  Patient reports he saw his pulmonologist today he called their clinic as he did not seem to be fully recovered from the recent pneumonia.  After seeing them they recommended he come to the ER for IV antibiotic  I also discussed the case with Dr. Francesca Oman pulmonologist who recommends the patient receive a noncontrast CT scan of the chest and admission for antibiotic for which he recommends vancomycin and Zosyn be initiated  Patient denies pain or discomfort.  Denies shortness of breath at the moment.  No wheezing.  Does not feel like he needs any sort of breathing treatments right now.  Finished his antibiotic and prednisone yesterday   Past Medical History:  Diagnosis Date   Arthritis    knee   Asthma    Cardiomyopathy ([place])    CHF (congestive heart failure) (HCC)    COPD (chronic obstructive pulmonary disease) (HCC)    emphysema   Environmental allergies    Hypertension    Presence of permanent cardiac pacemaker     Patient Active Problem List   Diagnosis Date Noted   Necrotic pneumonia ([place]) 08/24/2021   AKI (acute kidney injury) ([place]) 08/24/2021   COPD (chronic obstructive pulmonary disease) ([place]) 05/14/2018   Nonischemic cardiomyopathy ([place]) 05/11/2018   Reactive airway disease 07/25/2017   Mixed hyperlipidemia  07/25/2017   Hyperglycemia, unspecified 07/25/2017   Complete heart block ([place]) 07/25/2017   Allergic state 07/25/2017   Allergic rhinitis 07/25/2017   Allergic conjunctivitis 07/25/2017   Bilateral leg edema 04/13/2017   Hx of adenomatous colonic polyps 06/24/2016   Cardiac pacemaker 10/05/2015   Benign essential hypertension 29/52/8413   Chronic systolic CHF (congestive heart failure), NYHA class 3 ([place]) 10/21/2014    Past Surgical History:  Procedure Laterality Date   CATARACT EXTRACTION W/PHACO Right 12/09/2015   Procedure: CATARACT EXTRACTION PHACO AND INTRAOCULAR LENS PLACEMENT (IOC);  Surgeon: Leandrew Koyanagi, MD;  Location: [place];  Service: Ophthalmology;  Laterality: Right;   CATARACT EXTRACTION W/PHACO Left 01/13/2016   Procedure: CATARACT EXTRACTION PHACO AND INTRAOCULAR LENS PLACEMENT (IOC);  Surgeon: Leandrew Koyanagi, MD;  Location: [place];  Service: Ophthalmology;  Laterality: Left;   COLONOSCOPY     INSERT / REPLACE / REMOVE PACEMAKER  09/29/08    Prior to Admission medications   Medication Sig Start Date End Date Taking? Authorizing Provider  acetaminophen (TYLENOL) 325 MG tablet Take 500 mg by mouth every 6 (six) hours as needed for mild pain.    Yes [provider]  aspirin 81 MG tablet Take 81 mg by mouth daily. AM   Yes [provider]  azelastine (OPTIVAR) 0.05 % ophthalmic solution Place 1 drop into both eyes 2 (two) times daily as needed (allergies).    Yes [provider]  carvedilol (COREG) 12.5 MG tablet Take 12.5 mg by mouth 2 (two) times  daily with a meal.   Yes [provider]  ENTRESTO 24-26 MG Take 1 tablet by mouth 2 (two) times daily.  12/29/17  Yes [provider]  fluticasone (FLONASE) 50 MCG/ACT nasal spray Place 1 spray into both nostrils daily. PM    Yes [provider]  guaiFENesin (MUCINEX) 600 MG 12 hr tablet Take 600 mg by mouth 2 (two) times daily as needed  for to loosen phlegm.    Yes [provider]  levocetirizine (XYZAL) 5 MG tablet Take 5 mg by mouth every evening.   Yes [provider]  meloxicam (MOBIC) 15 MG tablet Take 15 mg by mouth daily as needed for pain.   Yes [provider]  montelukast (SINGULAIR) 10 MG tablet Take 10 mg by mouth every evening.    Yes [provider]  Fluticasone-Salmeterol (ADVAIR) 250-50 MCG/DOSE AEPB Inhale 1 puff into the lungs 2 (two) times daily. Patient not taking: Reported on 08/24/2021    [provider]  Ipratropium-Albuterol (COMBIVENT) 20-100 MCG/ACT AERS respimat Inhale 1 puff into the lungs every 6 (six) hours. Patient not taking: Reported on 08/24/2021    [provider]  torsemide (DEMADEX) 20 MG tablet Take 20 mg by mouth 2 (two) times daily. Patient takes 10 mg qid 04/17/18 04/17/19  [provider]    Allergies Patient has no known allergies.  Family History  Problem Relation Age of Onset   Asthma Father    Heart attack Sister    Asthma Brother    Rheum arthritis Other     Social History Social History   Tobacco Use   Smoking status: Never   Smokeless tobacco: Never  Vaping Use   Vaping Use: Never used  Substance Use Topics   Alcohol use: No   Drug use: No    Review of Systems Constitutional: No fever/chills Eyes: No visual changes. ENT: No sore throat. Cardiovascular: Denies chest pain. Respiratory: See HPI Gastrointestinal: No abdominal pain.   Genitourinary: Negative for dysuria. Musculoskeletal: Negative for back pain muscular pain. Skin: Negative for rash. Neurological: Negative for headaches but does have some just general feeling of fatigue and weakness but nothing focal.    ____________________________________________   PHYSICAL EXAM:  VITAL SIGNS: ED Triage Vitals  Enc Vitals Group     BP 08/24/21 1637 (!) 150/137     Pulse Rate 08/24/21 1637 96     Resp 08/24/21 1637 18     Temp 08/24/21 1637  98.2 F (36.8 C)     Temp Source 08/24/21 1637 Oral     SpO2 08/24/21 1637 96 %     Weight 08/24/21 1638 196 lb (88.9 kg)     Height 08/24/21 1638 _0  (1.753 m)     Head Circumference --      Peak Flow --      Pain Score 08/24/21 1638 0     Pain Loc --      Pain Edu? --      Excl. in [place]? --     Constitutional: Alert and oriented. Well appearing and in no acute distress.  Patient is wife both very pleasant. Eyes: Conjunctivae are normal. Head: Atraumatic. Nose: No congestion/rhinnorhea. Mouth/Throat: Mucous membranes are moist. Neck: No stridor.  Cardiovascular: Normal rate, regular rhythm. Grossly normal heart sounds.  Good peripheral circulation. Respiratory: Normal respiratory effort except for chest minimally using accessory muscles but in no acute distress or extremis.  No retractions.  Lungs demonstrate a very mild end  expiratory wheeze.  Perhaps some slight crackles in the right upper lobe but no noted significant rales or diffuse rhonchi Gastrointestinal: Soft and nontender. No distention. Musculoskeletal: No lower extremity tenderness nor edema. Neurologic:  Normal speech and language. No gross focal neurologic deficits are appreciated.  Skin:  Skin is warm, dry and intact. No rash noted. Psychiatric: Mood and affect are normal. Speech and behavior are normal.  ____________________________________________   LABS (all labs ordered are listed, but only abnormal results are displayed)  Labs Reviewed  BASIC METABOLIC PANEL - Abnormal; Notable for the following components:      Result Value   Sodium 131 (*)    Chloride 97 (*)    Glucose, Bld 132 (*)    BUN 29 (*)    Creatinine, Ser 1.75 (*)    Calcium 8.6 (*)    GFR, Estimated 38 (*)    All other components within normal limits  CBC - Abnormal; Notable for the following components:   WBC 14.0 (*)    Hemoglobin 12.0 (*)    HCT 35.4 (*)    RDW 15.9 (*)    All other components within normal limits  SEDIMENTATION  RATE - Abnormal; Notable for the following components:   Sed Rate 85 (*)    All other components within normal limits  RESP PANEL BY RT-PCR (FLU A&B, COVID) ARPGX2  EXPECTORATED SPUTUM ASSESSMENT W GRAM STAIN, RFLX TO RESP C  ACID FAST SMEAR (AFB, MYCOBACTERIA)  ACID FAST CULTURE WITH REFLEXED SENSITIVITIES (MYCOBACTERIA)  CULTURE, BLOOD (ROUTINE X 2)  CULTURE, BLOOD (ROUTINE X 2)  ASPERGILLUS ANTIGEN, BAL/SERUM  CULTURE, FUNGUS WITHOUT SMEAR  CULTURE, RESPIRATORY W GRAM STAIN  MRSA NEXT GEN BY PCR, NASAL  HISTOPLASMA [place] AG SER  LEGIONELLA PNEUMOPHILA SEROGP 1 UR AG  PROCALCITONIN  CRYPTOCOCCUS ANTIGEN, SERUM  C-REACTIVE PROTEIN  FUNGITELL, SERUM  TROPONIN I (HIGH SENSITIVITY)   ____________________________________________  EKG  Reviewed inter by me at 1659 heart rate 125 QRS 99 QTc 530 Atrial sensed ventricular paced rhythm.  No evidence of acute ischemic abnormality denoted in the T waves ____________________________________________  RADIOLOGY  DG Chest 2 View  Result Date: 08/24/2021 CLINICAL DATA:  Chest pain EXAM: CHEST - 2 VIEW COMPARISON:  08/12/2021 FINDINGS: Left chest wall ICD noted with leads in the right atrial appendage, coronary sinus and right ventricle. Heart size appears normal. No pleural effusion identified. Right upper lobe cavitary process is identified measuring approximately 7.4 x 7.6 cm. Left lung appears clear. Visualized osseous structures appear intact. IMPRESSION: Right upper lobe cavitary process is identified. Findings may reflect cavitating pneumonia versus underlying malignancy. Suggest further evaluation with CT of the chest. Electronically Signed   By: Kerby Moors M.D.   On: 08/24/2021 17:42   CT Chest Wo Contrast  Result Date: 08/24/2021 CLINICAL DATA:  Unresolved pneumonia. EXAM: CT CHEST WITHOUT CONTRAST TECHNIQUE: Multidetector CT imaging of the chest was performed following the standard protocol without IV contrast. COMPARISON:   03/11/2020 FINDINGS: Cardiovascular: Normal heart size. There is a left chest wall ICD with leads in the right atrial appendage, coronary sinus and right ventricle. Normal heart size. No pericardial effusion. Mediastinum/Nodes: Normal appearance of the thyroid gland. The trachea appears patent and is midline. Normal appearance of the esophagus. No enlarged supraclavicular or axillary lymph nodes. Prominent right paratracheal lymph nodes are identified measuring up to 0.9 cm, image 66/2. None of these meet CT criteria for adenopathy. The hilar structures are suboptimally evaluated due to lack of IV contrast  material. There is abrupt cut off of the right upper lobe bronchus just beyond the takeoff, image 104/5. This is new when compared with remote study dated 02/14/2019 but appears similar to 03/11/20. Here, there is a suggestion of a small central soft tissue nodule measuring 1.1 cm, image 64/2. Lungs/Pleura: No pleural effusion. Extensive postobstructive changes are identified within the right upper lobe. Large cavitary lung mass is noted measuring 8.6 x 5.2 by 6.1 cm, image 54/3. these changes have progressed when compared with 03/11/2020 when there was only mild anterior basal and posterior basal subsegmental atelectasis. The left lung is clear. Upper Abdomen: No acute abnormality identified within the imaged portions of the upper abdomen. Low-density structure within posterior right hepatic lobe is unchanged from previous exam measuring 2.4 cm. Musculoskeletal: No chest wall mass or suspicious bone lesions identified. IMPRESSION: 1. Large cavitary lung mass within the right upper lobe is identified with surrounding postobstructive changes. Findings have progressed when compared with 03/11/2020 when there was only mild anterior basal and posterior basal subsegmental atelectasis. Differential considerations include inflammatory/infectious etiologies as well as neoplastic processes. 2. Abrupt cut off of the right  upper lobe bronchus just beyond the takeoff. This is new when compared with remote study dated 02/14/2019 but appears similar to 03/11/20. Here, there is a suggestion of a small central soft tissue nodule measuring 1.1 cm. Chronic, indolent neoplastic process can not be excluded. Consider further evaluation with bronchoscopy. 3. Prominent right paratracheal lymph nodes are identified measuring up to 0.9 cm. None of these meet CT criteria for adenopathy. Attention on follow-up imaging is advised. 4. Aortic atherosclerosis. Aortic Atherosclerosis (ICD10-I70.0). Electronically Signed   By: Kerby Moors M.D.   On: 08/24/2021 18:18    CT imaging reviewed, multiple findings as detailed above however most notable for a large cavitary lung mass in the right upper lobe  ____________________________________________   PROCEDURES  Procedure(s) performed: None  Procedures  Critical Care performed: Yes, see critical care note(s)  CRITICAL CARE Performed by: Delman Kitten   Total critical care time: 25 minutes  Critical care time was exclusive of separately billable procedures and treating other patients.  Critical care was necessary to treat or prevent imminent or life-threatening deterioration.  Critical care was time spent personally by me on the following activities: development of treatment plan with patient and/or surrogate as well as nursing, discussions with consultants, evaluation of patient's response to treatment, examination of patient, obtaining history from patient or surrogate, ordering and performing treatments and interventions, ordering and review of laboratory studies, ordering and review of radiographic studies, pulse oximetry and re-evaluation of patient's condition.  ____________________________________________   INITIAL IMPRESSION / ASSESSMENT AND PLAN / ED COURSE  Pertinent labs & imaging results that were available during my care of the patient were reviewed by me and  considered in my medical decision making (see chart for details).   Patient presents for concerns of an ongoing infection and ongoing hemoptysis after recent treatment for right upper lobe pneumonia.  Pulmonologist advises patient will need admission for IV broad-spectrum antibiotics and pulmonary consult, he saw the patient in clinic earlier today  Patient is resting comfortably at this time in no distress mild wheezing and very mild use of accessory muscles but patient reports this is normal work of breathing.  He does not appear acutely ill or have an oxygen requirement at this time.  He denies any chest pain.  Patient is understanding agreeable with plan for admission, and reviewing his labs I  do note he has some what appears to be acute kidney injury, he does also demonstrate leukocytosis.  He did initially present with tachycardia which is now improved.  I will initiate code sepsis, broad-spectrum antibiotics, and admission to the hospital.  Lactic acid is pending, no hypotension to noted, I do not see need for large volume resuscitation indicated at this exact point time.  Clinical Course as of 08/24/21 1920  Tue Aug 24, 2021  1705 Discussed with Dr. Vanetta Mulders. Advises needs admit for CT non-contrast to evaluate extent of RUL lesion and possible bronchosopy.  [MQ]    Clinical Course User Index [MQ] Delman Kitten, MD   Admission discussed with Dr. Loleta Books.  Ongoing imaging including CT imaging is pending at the time of admission decision but has been discussed with and will be followed up with the patient's pulmonologist will see him this evening and also discussed plan of care with Dr. Loleta Books.  Vitals:   08/24/21 1637 08/24/21 1837  BP: (!) 150/137 106/68  Pulse: 96 78  Resp: 18 18  Temp: 98.2 F (36.8 C)   SpO2: 96% 97%    ----------------------------------------- 7:20 PM on 08/24/2021 ----------------------------------------- COVID-negative.  White count elevated.  AKI with  elevated creatinine.  Patient admitted to hospitalist service, pulmonary consult pending  ____________________________________________   FINAL CLINICAL IMPRESSION(S) / ED DIAGNOSES  Final diagnoses:  Community acquired pneumonia, unspecified laterality        Note:  This document was prepared using Systems analyst and may include unintentional dictation errors       Delman Kitten, MD 08/24/21 1920

## 2021-08-24 NOTE — Sepsis Progress Note (Signed)
eLink is monitoring this Code Sepsis. °

## 2021-08-24 NOTE — ED Triage Notes (Signed)
Pt was seen at his PMD on the 8/28 and they put him on antibiotics, he felt  that he was not getting any better so he went to his PMD today and they told him to come here to be admitted because he needs to be on a stronger antibiotic. Wanting to get IV antibiotics.

## 2021-08-24 NOTE — ED Notes (Signed)
Pt sent by Pulmonologist d/t chronic PNA.  Pt reported coughing up blood at home.  NAD noted.

## 2021-08-24 NOTE — Progress Notes (Signed)
PHARMACY -  BRIEF ANTIBIOTIC NOTE   Pharmacy has received consult(s) for vancomycin and Zosyn from an ED provider.  The patient's profile has been reviewed for ht/wt/allergies/indication/available labs.    One time order(s) placed for vancomycin 1,000 mg x 1 and Zosyn 3.375 grams over 30 minutes x 1  Further antibiotics/pharmacy consults should be ordered by admitting physician if indicated.                       Thank you, Jaynie Bream, PharmD Pharmacy Resident  08/24/2021 5:26 PM

## 2021-08-24 NOTE — Consult Note (Signed)
Pharmacy Antibiotic Note  Patrick Figueroa. is a 85 y.o. male admitted on 08/24/2021 with cavitary pneumonia.  Patient developed fatigue about 1 month ago, subsequently cough, hemoptysis, night sweats. Pharmacy has been consulted for cefepime and Zosyn dosing.  Scr 1.75 (baseline ~ 1.2)  Plan: Initiate Zosyn 3.375 gram Q8H (4-hour infusion) Vancomycin 1000 mg x 1 ordered in ED. Initiate Vancomycin 1000 mg Q24H to start now for total LD of 2000 mg x 1 Goal AUC 400-550 Estimated AUC: 497/Cmin 14.3 Scr used: 1.75, IBW, Vd 0.72 Follow up cultures, monitor renal function  Height: _0  (175.3 cm) Weight: 88.9 kg (196 lb) IBW/kg (Calculated) : 70.7  Temp (24hrs), Avg:98.2 F (36.8 C), Min:98.2 F (36.8 C), Max:98.2 F (36.8 C)  Recent Labs  Lab 08/24/21 1638  WBC 14.0*  CREATININE 1.75*    Estimated Creatinine Clearance: 35.9 mL/min (A) (by C-G formula based on SCr of 1.75 mg/dL (H)).    No Known Allergies  Antimicrobials this admission: 9/6 Zosyn >>  9/6 vancomycin >>   Dose adjustments this admission: N/a  Microbiology results: 9/6 BCx: sent 9/6 Aspergillus Ag, BAL/serum: sent 9/6 culture fungus: sent 9/6 Sputum: sent 9/6 Acid fast smear: sent  9/6 MRSA PCR: sent  Thank you for allowing pharmacy to be a part of this patient's care.  Dorothe Pea, PharmD, BCPS Clinical Pharmacist   08/24/2021 6:39 PM

## 2021-08-24 NOTE — Progress Notes (Signed)
CODE SEPSIS - PHARMACY COMMUNICATION  **Broad Spectrum Antibiotics should be administered within 1 hour of Sepsis diagnosis**  Time Code Sepsis Called/Page Received: 1742  Antibiotics Ordered: Zosyn/Vancomcyin  Time of 1st antibiotic administration: 1808  Additional action taken by pharmacy: none  If necessary, Name of Provider/Nurse Contacted: n/a   Albina Billet, PharmD, BCPS Clinical Pharmacist 08/24/2021 6:13 PM

## 2021-08-25 DIAGNOSIS — N179 Acute kidney failure, unspecified: Secondary | ICD-10-CM

## 2021-08-25 DIAGNOSIS — I10 Essential (primary) hypertension: Secondary | ICD-10-CM

## 2021-08-25 DIAGNOSIS — I5022 Chronic systolic (congestive) heart failure: Secondary | ICD-10-CM

## 2021-08-25 LAB — URINALYSIS, COMPLETE (UACMP) WITH MICROSCOPIC
Bacteria, UA: NONE SEEN
Bilirubin Urine: NEGATIVE
Glucose, UA: NEGATIVE mg/dL
Hgb urine dipstick: NEGATIVE
Ketones, ur: NEGATIVE mg/dL
Leukocytes,Ua: NEGATIVE
Nitrite: NEGATIVE
Protein, ur: NEGATIVE mg/dL
RBC / HPF: NONE SEEN RBC/hpf (ref 0–5)
Specific Gravity, Urine: 1.01 (ref 1.005–1.030)
Squamous Epithelial / HPF: NONE SEEN (ref 0–5)
pH: 6.5 (ref 5.0–8.0)

## 2021-08-25 LAB — BASIC METABOLIC PANEL
Anion gap: 7 (ref 5–15)
BUN: 22 mg/dL (ref 8–23)
CO2: 24 mmol/L (ref 22–32)
Calcium: 8.3 mg/dL — ABNORMAL LOW (ref 8.9–10.3)
Chloride: 102 mmol/L (ref 98–111)
Creatinine, Ser: 1.31 mg/dL — ABNORMAL HIGH (ref 0.61–1.24)
GFR, Estimated: 54 mL/min — ABNORMAL LOW (ref >60)
Glucose, Bld: 133 mg/dL — ABNORMAL HIGH (ref 70–99)
Potassium: 3.9 mmol/L (ref 3.5–5.1)
Sodium: 133 mmol/L — ABNORMAL LOW (ref 135–145)

## 2021-08-25 LAB — C-REACTIVE PROTEIN: CRP: 12.2 mg/dL — ABNORMAL HIGH (ref <1.0)

## 2021-08-25 LAB — CBC
HCT: 32.4 % — ABNORMAL LOW (ref 39.0–52.0)
Hemoglobin: 11 g/dL — ABNORMAL LOW (ref 13.0–17.0)
MCH: 27.5 pg (ref 26.0–34.0)
MCHC: 34 g/dL (ref 30.0–36.0)
MCV: 81 fL (ref 80.0–100.0)
Platelets: 257 10*3/uL (ref 150–400)
RBC: 4 MIL/uL — ABNORMAL LOW (ref 4.22–5.81)
RDW: 15.8 % — ABNORMAL HIGH (ref 11.5–15.5)
WBC: 11.1 10*3/uL — ABNORMAL HIGH (ref 4.0–10.5)
nRBC: 0 % (ref 0.0–0.2)

## 2021-08-25 LAB — SODIUM, URINE, RANDOM: Sodium, Ur: 44 mmol/L

## 2021-08-25 LAB — ACID FAST SMEAR (AFB, MYCOBACTERIA): Acid Fast Smear: NEGATIVE

## 2021-08-25 LAB — CREATININE, URINE, RANDOM: Creatinine, Urine: 76 mg/dL

## 2021-08-25 MED ORDER — PSYLLIUM 95 % PO PACK
1.0000 | PACK | Freq: Every day | ORAL | Status: DC
  Administered 2021-08-25 – 2021-08-27 (×2): 1 via ORAL
  Filled 2021-08-25 (×4): qty 1

## 2021-08-25 MED ORDER — SACUBITRIL-VALSARTAN 24-26 MG PO TABS
1.0000 | ORAL_TABLET | Freq: Two times a day (BID) | ORAL | Status: DC
  Administered 2021-08-25 – 2021-08-27 (×2): 1 via ORAL
  Filled 2021-08-25 (×6): qty 1

## 2021-08-25 NOTE — Progress Notes (Signed)
Pulmonary Medicine          Date: 08/25/2021,   MRN# [id number] Patrick Figueroa. 03-22-1936     AdmissionWeight: 88.9 kg                 CurrentWeight: 88.9 kg   Reffering physician : Dr Jacqualine Code   CHIEF COMPLAINT:   Non-massive hemoptysis with right upper lobe necrotizing pneumonia and failure of outpatient therapy.   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  He was seen for acute onset respiratory distress with productive cough 08/11/21 and was prescribed antibiotics and prednisone after CXR done in clinic revealed cavitary lesion of RUL with signs of necrotizing pneumonia.  He did complete antibiotic and prednisone burst but did not imrprove with complaints of ongoing hemoptysis and sever dyspnea with cough. He came in to ER due to failure of outpatient therapy and in ED had preliminary blood work with leukocytosis, AKI with reduced GFR at 38.  He had CT chest done showing extensive consolidated infiltrate of RUL with cavitary component suggestive of ongoing pneumonia. PCCM consultation placed for additional evaluation and management.   08/25/21- patient clinically improved, labwork improved. We discussed bronchoscopy with bal tommorow. NPO tonight. Patient wishes to proceed with bronch asap.  PAST MEDICAL HISTORY   Past Medical History:  Diagnosis Date  . Arthritis    knee  . Asthma   . Cardiomyopathy ([place])   . CHF (congestive heart failure) ([place])   . COPD (chronic obstructive pulmonary disease) ([facility])    emphysema  . Environmental allergies   . Hypertension   . Presence of permanent cardiac pacemaker      SURGICAL HISTORY   Past Surgical History:  Procedure Laterality Date  . CATARACT EXTRACTION W/PHACO  Right 12/09/2015   Procedure: CATARACT EXTRACTION PHACO AND INTRAOCULAR LENS PLACEMENT ([place]);  Surgeon: Leandrew Koyanagi, MD;  Location: [facility];  Service: Ophthalmology;  Laterality: Right;  . CATARACT EXTRACTION W/PHACO Left 01/13/2016   Procedure: CATARACT EXTRACTION PHACO AND INTRAOCULAR LENS PLACEMENT ([facility]);  Surgeon: Leandrew Koyanagi, MD;  Location: [facility];  Service: Ophthalmology;  Laterality: Left;  . COLONOSCOPY    . INSERT / REPLACE / REMOVE PACEMAKER  09/29/08     FAMILY HISTORY   Family History  Problem Relation Age of Onset  . Asthma Father   . Heart attack Sister   . Asthma Brother   . Rheum arthritis Other      SOCIAL HISTORY   Social History   Tobacco Use  . Smoking status: Never  . Smokeless tobacco: Never  Vaping Use  . Vaping Use: Never used  Substance Use Topics  . Alcohol use: No  . Drug use: No     MEDICATIONS    Home Medication:  REM   Current Medication:  Current Facility-Administered Medications:  .  acetaminophen (TYLENOL) tablet 650 mg, 650 mg, Oral, Q6H PRN **OR** acetaminophen (TYLENOL) suppository 650 mg, 650 mg, Rectal, Q6H PRN, Danford, Suann Larry, MD .  aspirin EC tablet 81 mg, 81 mg, Oral, Daily, Danford, Suann Larry, MD, 81 mg at 08/25/21 0814 .  benzonatate (TESSALON) capsule 200 mg, 200 mg, Oral, TID, Danford, Suann Larry, MD, 200 mg at 08/25/21 1641 .  carvedilol (COREG) tablet 12.5 mg, 12.5 mg, Oral, BID WC, Danford, Suann Larry, MD, 12.5 mg at 08/25/21 1641 .  enoxaparin (LOVENOX) injection 40 mg, 40 mg, Subcutaneous, Q24H, Danford, Suann Larry, MD, 40 mg at 08/24/21 2155 .  guaiFENesin (MUCINEX) 12 hr tablet 600 mg, 600 mg, Oral, BID PRN, Danford, Suann Larry, MD .  influenza vaccine adjuvanted (FLUAD) injection 0.5 mL, 0.5 mL, Intramuscular, Prior to discharge, Danford, Suann Larry, MD .  ketotifen (ZADITOR) 0.025 % ophthalmic solution 1 drop, 1 drop, Both Eyes, BID, Danford,  Suann Larry, MD, 1 drop at 08/25/21 0809 .  loratadine (CLARITIN) tablet 10 mg, 10 mg, Oral, QPM, Danford, Suann Larry, MD, 10 mg at 08/25/21 1641 .  montelukast (SINGULAIR) tablet 10 mg, 10 mg, Oral, QPM, Danford, Suann Larry, MD, 10 mg at 08/25/21 1642 .  ondansetron (ZOFRAN) tablet 4 mg, 4 mg, Oral, Q6H PRN **OR** ondansetron (ZOFRAN) injection 4 mg, 4 mg, Intravenous, Q6H PRN, Danford, Christopher P, MD .  piperacillin-tazobactam (ZOSYN) IVPB 3.375 g, 3.375 g, Intravenous, Q8H, Dolan, Carissa E, RPH, Last Rate: 12.5 mL/hr at 08/25/21 1643, 3.375 g at 08/25/21 1643 .  predniSONE (DELTASONE) tablet 10 mg, 10 mg, Oral, Q breakfast, Danford, Suann Larry, MD, 10 mg at 08/25/21 0808 .  psyllium (HYDROCIL/METAMUCIL) 1 packet, 1 packet, Oral, Daily, Fritzi Mandes, MD, 1 packet at 08/25/21 1821 .  sacubitril-valsartan (ENTRESTO) 24-26 mg per tablet, 1 tablet, Oral, BID, Fritzi Mandes, MD, 1 tablet at 08/25/21 1447    ALLERGIES   Patient has no known allergies.     REVIEW OF SYSTEMS    Review of Systems:  Gen:  Denies  fever, sweats, chills weigh loss  HEENT: Denies blurred vision, double vision, ear pain, eye pain, hearing loss, nose bleeds, sore throat Cardiac:  No dizziness, chest pain or heaviness, chest tightness,edema Resp:   Denies cough or sputum porduction, shortness of breath,wheezing, hemoptysis,  Gi: Denies swallowing difficulty, stomach pain, nausea or vomiting, diarrhea, constipation, bowel incontinence Gu:  Denies bladder incontinence, burning urine Ext:   Denies Joint pain, stiffness or swelling Skin: Denies  skin rash, easy bruising or bleeding or hives Endoc:  Denies polyuria, polydipsia , polyphagia or weight change Psych:   Denies depression, insomnia or hallucinations   Other:  All other systems negative   VS: BP 112/71 (BP Location: Left Arm)   Pulse 73   Temp 98.1 F (36.7 C) (Oral)   Resp 18   Ht _0  (1.753 m)   Wt 88.9 kg   SpO2 100%   BMI 28.94  kg/m      PHYSICAL EXAM    GENERAL:NAD, no fevers, chills, no weakness no fatigue HEAD: Normocephalic, atraumatic.  EYES: Pupils equal, round, reactive to light. Extraocular muscles intact. No scleral icterus.  MOUTH: Moist mucosal membrane. Dentition intact. No abscess noted.  EAR, NOSE, THROAT: Clear without exudates. No external lesions.  NECK: Supple. No thyromegaly. No nodules. No JVD.  PULMONARY: rhonchi at right upper lung zone CARDIOVASCULAR: S1 and S2. Regular rate and rhythm. No murmurs, rubs, or gallops. No edema. Pedal pulses 2+ bilaterally.  GASTROINTESTINAL: Soft, nontender, nondistended. No masses. Positive bowel sounds. No hepatosplenomegaly.  MUSCULOSKELETAL: No swelling, clubbing, or edema. Range of motion full in all extremities.  NEUROLOGIC: Cranial nerves II through XII are intact. No gross focal neurological deficits. Sensation intact. Reflexes intact.  SKIN: No ulceration, lesions, rashes, or cyanosis. Skin warm and dry. Turgor intact.  PSYCHIATRIC: Mood, affect within normal limits. The patient is  awake, alert and oriented x 3. Insight, judgment intact.       IMAGING    DG Chest 2 View  Result Date: 08/24/2021 CLINICAL DATA:  Chest pain EXAM: CHEST - 2 VIEW COMPARISON:  08/12/2021 FINDINGS: Left chest wall ICD noted with leads in the right atrial appendage, coronary sinus and right ventricle. Heart size appears normal. No pleural effusion identified. Right upper lobe cavitary process is identified measuring approximately 7.4 x 7.6 cm. Left lung appears clear. Visualized osseous structures appear intact. IMPRESSION: Right upper lobe cavitary process is identified. Findings may reflect cavitating pneumonia versus underlying malignancy. Suggest further evaluation with CT of the chest. Electronically Signed   By: Kerby Moors M.D.   On: 08/24/2021 17:42   CT Chest Wo Contrast  Result Date: 08/24/2021 CLINICAL DATA:  Unresolved pneumonia. EXAM: CT CHEST WITHOUT  CONTRAST TECHNIQUE: Multidetector CT imaging of the chest was performed following the standard protocol without IV contrast. COMPARISON:  03/11/2020 FINDINGS: Cardiovascular: Normal heart size. There is a left chest wall ICD with leads in the right atrial appendage, coronary sinus and right ventricle. Normal heart size. No pericardial effusion. Mediastinum/Nodes: Normal appearance of the thyroid gland. The trachea appears patent and is midline. Normal appearance of the esophagus. No enlarged supraclavicular or axillary lymph nodes. Prominent right paratracheal lymph nodes are identified measuring up to 0.9 cm, image 66/2. None of these meet CT criteria for adenopathy. The hilar structures are suboptimally evaluated due to lack of IV contrast material. There is abrupt cut off of the right upper lobe bronchus just beyond the takeoff, image 104/5. This is new when compared with remote study dated 02/14/2019 but appears similar to 03/11/20. Here, there is a suggestion of a small central soft tissue nodule measuring 1.1 cm, image 64/2. Lungs/Pleura: No pleural effusion. Extensive postobstructive changes are identified within the right upper lobe. Large cavitary lung mass is noted measuring 8.6 x 5.2 by 6.1 cm, image 54/3. these changes have progressed when compared with 03/11/2020 when there was only mild anterior basal and posterior basal subsegmental atelectasis. The left lung is clear. Upper Abdomen: No acute abnormality identified within the imaged portions of the upper abdomen. Low-density structure within posterior right hepatic lobe is unchanged from previous exam measuring 2.4 cm. Musculoskeletal: No chest wall mass or suspicious bone lesions identified. IMPRESSION: 1. Large cavitary lung mass within the right upper lobe is identified with surrounding postobstructive changes. Findings have progressed when compared with 03/11/2020 when there was only mild anterior basal and posterior basal subsegmental atelectasis.  Differential considerations include inflammatory/infectious etiologies as well as neoplastic processes. 2. Abrupt cut off of the right upper lobe bronchus just beyond the takeoff. This is new when compared with remote study dated 02/14/2019 but appears similar to 03/11/20. Here, there is a suggestion of a small central soft tissue nodule measuring 1.1 cm. Chronic, indolent neoplastic process can not be excluded. Consider further evaluation with bronchoscopy. 3. Prominent right paratracheal lymph nodes are identified measuring up to 0.9 cm. None of these meet CT criteria for adenopathy. Attention on follow-up imaging is advised. 4. Aortic atherosclerosis. Aortic Atherosclerosis (ICD10-I70.0). Electronically Signed   By: Kerby Moors M.D.   On: 08/24/2021 18:18      ASSESSMENT/PLAN   Acute hypoxemic respiratory failure - present on admission  - COVID19 negative   - will perform infectious workup for pneumonia -serum fungitell -legionella ab -strep pneumoniae ur AG -Histoplasma Ur Ag -sputum resp cultures -AFB sputum expectorated specimen -sputum cytology  -  reviewed pertinent imaging with patient today - ESR, CRP -PT/OT  -may need bronchoscopic evaluation will discuss further with patient and wife  Non-massive hemoptysis    - cough suppresion with tessalon     - patient does not tolerate narcotics   Moderate persistent asthma   - albuterol  neb q6h and prednisone 10 mg for now   Chronic cylindrical Bronchiectasis  -Patient empirically on Zosyn and Vancomycin for pna  Chronic renal failure KDIGO 3   - judicious use of IVF    - reduction of non-essential nephrotoxins      -please encourage patient to use incentive spirometer few times each hour while hospitalized.        Thank you for allowing me to participate in the care of this patient.  Total face to face encounter time for this patient visit was >45 min. >50% of the time was  spent in counseling and coordination of  care.   Patient/Family are satisfied with care plan and all questions have been answered.  This document was prepared using Dragon voice recognition software and may include unintentional dictation errors.     Ottie Glazier, M.D.  Division of [place]

## 2021-08-25 NOTE — Care Management Important Message (Signed)
Important Message  Patient Details  Name: Patrick Figueroa. MRN: [id number] Date of Birth: 1936/12/09   Medicare Important Message Given:  N/A - LOS <3 / Initial given by admissions  Initial Medicare IM reviewed with patient by Caprice Renshaw, Patient Access Associate on 08/25/2021 at 9:28am.     Johnell Comings 08/25/2021, 9:51 AM

## 2021-08-25 NOTE — Progress Notes (Signed)
[facility]  - [place] [facility]   PATIENT NAME: Patrick Figueroa    MR#:  [id number]  DATE OF BIRTH:  [birth year]  SUBJECTIVE:   patient came in with coughing of blood. Seen at pulmonary office yesterday admitted for worsening right lung infection. Currently afebrile. Eating well. No family in the room oxygen saturations normal on room air. Patient still spitting up some streaks of blood. REVIEW OF SYSTEMS:   Review of Systems  Constitutional:  Negative for chills, fever and weight loss.  HENT:  Negative for ear discharge, ear pain and nosebleeds.   Eyes:  Negative for blurred vision, pain and discharge.  Respiratory:  Positive for cough, hemoptysis, sputum production and shortness of breath. Negative for wheezing and stridor.   Cardiovascular:  Negative for chest pain, palpitations, orthopnea and PND.  Gastrointestinal:  Negative for abdominal pain, diarrhea, nausea and vomiting.  Genitourinary:  Negative for frequency and urgency.  Musculoskeletal:  Negative for back pain and joint pain.  Neurological:  Positive for weakness. Negative for sensory change, speech change and focal weakness.  Psychiatric/Behavioral:  Negative for depression and hallucinations. The patient is not nervous/anxious.   Tolerating Diet:yes Tolerating PT:   DRUG ALLERGIES:  No Known Allergies  VITALS:  Blood pressure 107/66, pulse 75, temperature 97.6 F (36.4 C), resp. rate 18, height 5\' 9"  (1.753 m), weight 88.9 kg, SpO2 97 %.  PHYSICAL EXAMINATION:   Physical Exam  GENERAL:  85 y.o.-year-old patient lying in the bed with no acute distress. Obese LUNGS: decreased breath sounds right >left, no wheezing, rales, rhonchi. No use of accessory muscles of respiration.  CARDIOVASCULAR: S1, S2 normal. No murmurs, rubs, or gallops.  ABDOMEN: Soft, nontender, nondistended. Bowel sounds present. No organomegaly or mass.  EXTREMITIES: No cyanosis, clubbing or edema b/l.    NEUROLOGIC: Cranial  nerves II through XII are intact. No focal Motor or sensory deficits b/l.   PSYCHIATRIC:  patient is alert and oriented x 3.  SKIN: No obvious rash, lesion, or ulcer.   LABORATORY PANEL:  CBC Recent Labs  Lab 08/25/21 0416  WBC 11.1*  HGB 11.0*  HCT 32.4*  PLT 257    Chemistries  Recent Labs  Lab 08/25/21 0416  NA 133*  K 3.9  CL 102  CO2 24  GLUCOSE 133*  BUN 22  CREATININE 1.31*  CALCIUM 8.3*   Cardiac Enzymes No results for input(s): TROPONINI in the last 168 hours. RADIOLOGY:  DG Chest 2 View  Result Date: 08/24/2021 CLINICAL DATA:  Chest pain EXAM: CHEST - 2 VIEW COMPARISON:  08/12/2021 FINDINGS: Left chest wall ICD noted with leads in the right atrial appendage, coronary sinus and right ventricle. Heart size appears normal. No pleural effusion identified. Right upper lobe cavitary process is identified measuring approximately 7.4 x 7.6 cm. Left lung appears clear. Visualized osseous structures appear intact. IMPRESSION: Right upper lobe cavitary process is identified. Findings may reflect cavitating pneumonia versus underlying malignancy. Suggest further evaluation with CT of the chest. Electronically Signed   By: 08/14/2021 M.D.   On: 08/24/2021 17:42   CT Chest Wo Contrast  Result Date: 08/24/2021 CLINICAL DATA:  Unresolved pneumonia. EXAM: CT CHEST WITHOUT CONTRAST TECHNIQUE: Multidetector CT imaging of the chest was performed following the standard protocol without IV contrast. COMPARISON:  03/11/2020 FINDINGS: Cardiovascular: Normal heart size. There is a left chest wall ICD with leads in the right atrial appendage, coronary sinus and right ventricle. Normal heart size. No pericardial effusion. Mediastinum/Nodes: Normal  appearance of the thyroid gland. The trachea appears patent and is midline. Normal appearance of the esophagus. No enlarged supraclavicular or axillary lymph nodes. Prominent right paratracheal lymph nodes are identified measuring up to 0.9 cm, image  66/2. None of these meet CT criteria for adenopathy. The hilar structures are suboptimally evaluated due to lack of IV contrast material. There is abrupt cut off of the right upper lobe bronchus just beyond the takeoff, image 104/5. This is new when compared with remote study dated 02/14/2019 but appears similar to 03/11/20. Here, there is a suggestion of a small central soft tissue nodule measuring 1.1 cm, image 64/2. Lungs/Pleura: No pleural effusion. Extensive postobstructive changes are identified within the right upper lobe. Large cavitary lung mass is noted measuring 8.6 x 5.2 by 6.1 cm, image 54/3. these changes have progressed when compared with 03/11/2020 when there was only mild anterior basal and posterior basal subsegmental atelectasis. The left lung is clear. Upper Abdomen: No acute abnormality identified within the imaged portions of the upper abdomen. Low-density structure within posterior right hepatic lobe is unchanged from previous exam measuring 2.4 cm. Musculoskeletal: No chest wall mass or suspicious bone lesions identified. IMPRESSION: 1. Large cavitary lung mass within the right upper lobe is identified with surrounding postobstructive changes. Findings have progressed when compared with 03/11/2020 when there was only mild anterior basal and posterior basal subsegmental atelectasis. Differential considerations include inflammatory/infectious etiologies as well as neoplastic processes. 2. Abrupt cut off of the right upper lobe bronchus just beyond the takeoff. This is new when compared with remote study dated 02/14/2019 but appears similar to 03/11/20. Here, there is a suggestion of a small central soft tissue nodule measuring 1.1 cm. Chronic, indolent neoplastic process can not be excluded. Consider further evaluation with bronchoscopy. 3. Prominent right paratracheal lymph nodes are identified measuring up to 0.9 cm. None of these meet CT criteria for adenopathy. Attention on follow-up imaging  is advised. 4. Aortic atherosclerosis. Aortic Atherosclerosis (ICD10-I70.0). Electronically Signed   By: Signa Kell M.D.   On: 08/24/2021 18:18   ASSESSMENT AND PLAN:   Patrick Figueroa. is a 85 y.o. M with hx of COPD, FEV1 24%, chronic bronchiectasis, complete heart block with PPM and ICD, and sCHF EF 40% who presents with 1 month fatigue, cough and developed hemoptysis  chest x-ray that showed a cavitary pneumonia  Necrotizing pneumonia  --Patient with persistent pneumonia, failed outpatient treatment with appropriate antibiotics.  -- Patient meets SIRS criteria but clinically is not septic, sepsis ruled out at admission.  --CT chest Large cavitary lung mass within the right upper lobe is identified with surrounding postobstructive changes. Findings have progressed when compared with 03/11/2020 when there was only mild anterior basal and posterior basal subsegmental atelectasis - Start vancomycin and Zosyn - Continue prednisone 10 mg daily - 9/7-- mild streaks of blood and sputum. Afebrile. White count improving. MRSA PCR negative, will DC vancomycin. Continue Zosyn. -- Pulmonary Dr.Aleskerov probably planning bronchoscopy - await sputum bacterial culture, fungal culture, AFB, Fungitell, cryptococcal antigen, Aspergillus antigen and Legionella urine antigen.   Acute kidney injury --Baseline Cr 1.1-1.2.  He received 1.75.   -- Creat trending down - Held [place], Mobic, diuretics/torsemide--now resume enteresto   COPD No evidence of flare - Give nebulized ICS/LABA - Continue Singulair   Complete heart block --Has history of pacemaker and ICD   Chronic systolic CHF Ischemic cardiomyopathy Appears euvolemic, no evidence of flare - Continue home aspirin - Continue Coreg - resume Entresto since  creat improving  Procedures: Family communication :none Consults :Pulm CODE STATUS: FULL DVT Prophylaxis : enoxaparin Level of care: Med-Surg Status is: Inpatient  Remains  inpatient appropriate because:Inpatient level of care appropriate due to severity of illness  Dispo: The patient is from: Home              Anticipated d/c is to: Home              Patient currently is not medically stable to d/c.   Difficult to place patient No        TOTAL TIME TAKING CARE OF THIS PATIENT: 30 minutes.  >50% time spent on counselling and coordination of care  Note: This dictation was prepared with Dragon dictation along with smaller phrase technology. Any transcriptional errors that result from this process are unintentional.  Enedina Finner M.D    [facility]   CC: Primary care physician; Marguarite Arbour, MD Patient ID: Patrick Figueroa., male   DOB: [birth year], 85 y.o.   MRN: [id number]

## 2021-08-25 NOTE — TOC CM/SW Note (Signed)
Chart reviewed. PCP is Aram Beecham, MD. On room air. No wounds. No TOC needs identified so far. Please consult TOC if any needs arise.  Charlynn Court, CSW [phone]

## 2021-08-26 ENCOUNTER — Inpatient Hospital Stay: Payer: Medicare Other | Admitting: Registered Nurse

## 2021-08-26 ENCOUNTER — Encounter
Admission: EM | Disposition: A | Discharge status: Home / Self Care | Payer: Self-pay | Source: Home / Self Care | Attending: Internal Medicine

## 2021-08-26 HISTORY — PX: VIDEO BRONCHOSCOPY: SHX5072

## 2021-08-26 LAB — BODY FLUID CELL COUNT WITH DIFFERENTIAL
Eos, Fluid: 0 %
Lymphs, Fluid: 6 %
Monocyte-Macrophage-Serous Fluid: 26 % — ABNORMAL LOW (ref 50–90)
Neutrophil Count, Fluid: 68 % — ABNORMAL HIGH (ref 0–25)
Total Nucleated Cell Count, Fluid: 67 cu mm (ref 0–1000)

## 2021-08-26 LAB — CRYPTOCOCCUS ANTIGEN, SERUM: Cryptococcus Antigen, Serum: NEGATIVE

## 2021-08-26 LAB — LEGIONELLA PNEUMOPHILA SEROGP 1 UR AG: L. pneumophila Serogp 1 Ur Ag: NEGATIVE

## 2021-08-26 SURGERY — BRONCHOSCOPY, VIDEO-ASSISTED
Anesthesia: General | Laterality: Bilateral

## 2021-08-26 MED ORDER — ROCURONIUM BROMIDE 10 MG/ML (PF) SYRINGE
PREFILLED_SYRINGE | INTRAVENOUS | Status: AC
  Filled 2021-08-26: qty 10

## 2021-08-26 MED ORDER — PROPOFOL 10 MG/ML IV BOLUS
INTRAVENOUS | Status: DC | PRN
  Administered 2021-08-26: 30 mg via INTRAVENOUS
  Administered 2021-08-26: 120 mg via INTRAVENOUS
  Administered 2021-08-26: 40 mg via INTRAVENOUS

## 2021-08-26 MED ORDER — FENTANYL CITRATE (PF) 100 MCG/2ML IJ SOLN
INTRAMUSCULAR | Status: DC | PRN
  Administered 2021-08-26: 50 ug via INTRAVENOUS
  Administered 2021-08-26 (×2): 25 ug via INTRAVENOUS

## 2021-08-26 MED ORDER — PROPOFOL 10 MG/ML IV BOLUS
INTRAVENOUS | Status: AC
  Filled 2021-08-26: qty 20

## 2021-08-26 MED ORDER — PHENYLEPHRINE HCL (PRESSORS) 10 MG/ML IV SOLN
INTRAVENOUS | Status: DC | PRN
  Administered 2021-08-26: 100 ug via INTRAVENOUS
  Administered 2021-08-26: 50 ug via INTRAVENOUS

## 2021-08-26 MED ORDER — LIDOCAINE HCL (CARDIAC) PF 100 MG/5ML IV SOSY
PREFILLED_SYRINGE | INTRAVENOUS | Status: DC | PRN
  Administered 2021-08-26: 100 mg via INTRAVENOUS

## 2021-08-26 MED ORDER — DEXAMETHASONE SODIUM PHOSPHATE 10 MG/ML IJ SOLN
INTRAMUSCULAR | Status: AC
  Filled 2021-08-26: qty 1

## 2021-08-26 MED ORDER — LACTATED RINGERS IV SOLN: INTRAVENOUS | Status: DC | PRN

## 2021-08-26 MED ORDER — LACTATED RINGERS IV BOLUS
1000.0000 mL | Freq: Once | INTRAVENOUS | Status: AC
  Administered 2021-08-26: 1000 mL via INTRAVENOUS

## 2021-08-26 MED ORDER — FENTANYL CITRATE (PF) 100 MCG/2ML IJ SOLN
INTRAMUSCULAR | Status: AC
  Filled 2021-08-26: qty 2

## 2021-08-26 MED ORDER — ONDANSETRON HCL 4 MG/2ML IJ SOLN
INTRAMUSCULAR | Status: DC | PRN
  Administered 2021-08-26: 4 mg via INTRAVENOUS

## 2021-08-26 MED ORDER — SUCCINYLCHOLINE CHLORIDE 200 MG/10ML IV SOSY
PREFILLED_SYRINGE | INTRAVENOUS | Status: DC | PRN
  Administered 2021-08-26: 100 mg via INTRAVENOUS

## 2021-08-26 MED ORDER — ONDANSETRON HCL 4 MG/2ML IJ SOLN
INTRAMUSCULAR | Status: AC
  Filled 2021-08-26: qty 2

## 2021-08-26 NOTE — Procedures (Addendum)
PROCEDURE: BRONCHOSCOPY Therapeutic Aspiration of Tracheobronchial Tree , BAL AND SURGICAL BIOPSY OF LUNG MASS  PROCEDURE DATE: 08/26/2021  TIME:  NAME:  Patrick Figueroa.  DOB:09-15-1936  MRN: [id number] LOC:  212A/212A-AA    HOSP DAY: _0 @ CODE STATUS:      Code Status Orders  (From admission, onward)           Start     Ordered   08/24/21 1941  Full code  Continuous        08/24/21 1940           Code Status History     Date Active Date Inactive Code Status Order ID Comments User Context   05/14/2018 1633 05/16/2018 1702 Full Code [id number]  SalaryAvel Peace, MD ED      Advance Directive Documentation    Flowsheet Row Most Recent Value  Type of Advance Directive Healthcare Power of Attorney  Pre-existing out of facility DNR order (yellow form or pink MOST form) --  "MOST" Form in Place? --         Media Information Document Information  Photos    08/26/2021 12:50  Attached To:  Hospital Encounter on 08/24/21   Media Information Document Information  Media Information Document Information  Photos    08/26/2021 12:47  Attached To:  Hospital Encounter on 08/24/21   Media Information Document Information  Photos    08/26/2021 12:47  Attached To:  Hospital Encounter on 08/24/21   Source Information  Ottie Glazier, MD  [facility]     Indications/Preliminary Diagnosis:   Consent: (Place X beside choice/s below)  The benefits, risks and possible complications of the procedure were        explained to:  __X_ patient  ___ patient's family  ___ other:___________  who verbalized understanding and gave:  ___ verbal  _X__ written  ___ verbal and written  ___ telephone  ___ other:________ consent.      Unable to obtain consent; procedure performed on emergent basis.     Other:       PRESEDATION ASSESSMENT: History and Physical has been performed. Patient meds and allergies have been reviewed. Presedation airway  examination has been performed and documented. Baseline vital signs, sedation score, oxygenation status, and cardiac rhythm were reviewed. Patient was deemed to be in satisfactory condition to undergo the procedure.        PROCEDURE DETAILS: Timeout performed and correct patient, name, & ID confirmed. Following prep per Pulmonary policy, appropriate sedation was administered. The Bronchoscope was inserted in to oral cavity with bite block in place. Therapeutic aspiration of Tracheobronchial tree was performed.  Airway exam proceeded with findings, technical procedures, and specimen collection as noted below. At the end of exam the scope was withdrawn without incident. Impression and Plan as noted below.         Airway Prep (Place X beside choice below)   1% Transtracheal Lidocaine Anesthetization 7 cc   Patient prepped per Bronchoscopy Lab Policy       Insertion Route (Place X beside choice below)   Nasal   Oral  X Endotracheal Tube   Tracheostomy    Medication Amt Dose  Medication Amt Dose  Lidocaine 1%  cc  Epinephrine 1:10,000 sol  cc  Xylocaine 4%  cc  Cocaine  cc   TECHNICAL PROCEDURES: (Place X beside choice below)   Procedures  Description    None     Electrocautery     Cryotherapy  Balloon Dilatation     Bronchography     Stent Placement   X2  Therapeutic Aspiration RUL    Laser/Argon Plasma    Brachytherapy Catheter Placement    Foreign Body Removal         SPECIMENS (Sites): (Place X beside choice below)  Specimens Description   No Specimens Obtained     [place]   X2 Lavage BAL X2 - RUL  X3 Biopsies SURGICAL FORCEPS X3-  RUL   Fine Needle Aspirates    Brushings    Sputum    FINDINGS:  ESTIMATED BLOOD LOSS: none COMPLICATIONS/RESOLUTION: none      IMPRESSION:POST-PROCEDURE DX:    MUCOID PLUGGING OF RUL WAS IRRIGATED AND EVACUATED FROM RUL, PATIENT HAD 100% EDEMA AND OCCLUSION OF ANTERIOR, APICAL AND POSTERIOR SEGMENT OF RIGHT UPPER LOBE  WITH WHAT APPEARED TO BE TUMOR/MALIGANCY.  CYTOBRUSH WAS PERFORMED AT RUL.  SURGICAL PATHOLOGY VIA FORCEPS BIOPSY WAS DONE X 3.     RECOMMENDATION/PLAN: AWAITING PATHOLOGY AND CYTOLOGY AND MICROBIOLOGY SPECIMENS SENT TO LAB.     Ottie Glazier, M.D.  Pulmonary & [place]

## 2021-08-26 NOTE — Transfer of Care (Signed)
Immediate Anesthesia Transfer of Care Note  Patient: Patrick Figueroa.  Procedure(s) Performed: VIDEO BRONCHOSCOPY (Bilateral)  Patient Location: Nursing Unit  Anesthesia Type:General  Level of Consciousness: awake, alert , oriented and patient cooperative  Airway & Oxygen Therapy: Patient Spontanous Breathing  Post-op Assessment: Report given to RN and Post -op Vital signs reviewed and stable  Post vital signs: Reviewed and stable  Last Vitals:  Vitals Value Taken Time  BP 93/56 08/26/21 1338  Temp 36.8 C 08/26/21 1338  Pulse 89 08/26/21 1338  Resp 17 08/26/21 1338  SpO2 97 % 08/26/21 1338    Last Pain:  Vitals:   08/26/21 0244  TempSrc: Oral  PainSc:          Complications: No notable events documented.

## 2021-08-26 NOTE — Progress Notes (Signed)
[facility]  - [facility]   PATIENT NAME: Patrick Figueroa    MR#:  [id number]  DATE OF BIRTH:  1936-03-28  SUBJECTIVE:   Sitting up in chair. Wife in the room No respiratory distress REVIEW OF SYSTEMS:   Review of Systems  Constitutional:  Negative for chills, fever and weight loss.  HENT:  Negative for ear discharge, ear pain and nosebleeds.   Eyes:  Negative for blurred vision, pain and discharge.  Respiratory:  Positive for cough, hemoptysis, sputum production and shortness of breath. Negative for wheezing and stridor.   Cardiovascular:  Negative for chest pain, palpitations, orthopnea and PND.  Gastrointestinal:  Negative for abdominal pain, diarrhea, nausea and vomiting.  Genitourinary:  Negative for frequency and urgency.  Musculoskeletal:  Negative for back pain and joint pain.  Neurological:  Positive for weakness. Negative for sensory change, speech change and focal weakness.  Psychiatric/Behavioral:  Negative for depression and hallucinations. The patient is not nervous/anxious.   Tolerating Diet:yes Tolerating PT: self ambulatory  DRUG ALLERGIES:  No Known Allergies  VITALS:  Blood pressure (!) 93/56, pulse 89, temperature 98.3 F (36.8 C), resp. rate 17, height 5\' 9"  (1.753 m), weight 88.9 kg, SpO2 97 %.  PHYSICAL EXAMINATION:   Physical Exam  GENERAL:  85 y.o.-year-old patient lying in the bed with no acute distress. Obese LUNGS: decreased breath sounds right >left, no wheezing, rales, rhonchi. No use of accessory muscles of respiration.  CARDIOVASCULAR: S1, S2 normal. No murmurs, rubs, or gallops.  ABDOMEN: Soft, nontender, nondistended. Bowel sounds present. No organomegaly or mass.  EXTREMITIES: No cyanosis, clubbing or edema b/l.    NEUROLOGIC: Cranial nerves II through XII are intact. No focal Motor or sensory deficits b/l.   PSYCHIATRIC:  patient is alert and oriented x 3.  SKIN: No obvious rash, lesion, or ulcer.   LABORATORY  PANEL:  CBC Recent Labs  Lab 08/25/21 0416  WBC 11.1*  HGB 11.0*  HCT 32.4*  PLT 257     Chemistries  Recent Labs  Lab 08/25/21 0416  NA 133*  K 3.9  CL 102  CO2 24  GLUCOSE 133*  BUN 22  CREATININE 1.31*  CALCIUM 8.3*    Cardiac Enzymes No results for input(s): TROPONINI in the last 168 hours. RADIOLOGY:  DG Chest 2 View  Result Date: 08/24/2021 CLINICAL DATA:  Chest pain EXAM: CHEST - 2 VIEW COMPARISON:  08/12/2021 FINDINGS: Left chest wall ICD noted with leads in the right atrial appendage, coronary sinus and right ventricle. Heart size appears normal. No pleural effusion identified. Right upper lobe cavitary process is identified measuring approximately 7.4 x 7.6 cm. Left lung appears clear. Visualized osseous structures appear intact. IMPRESSION: Right upper lobe cavitary process is identified. Findings may reflect cavitating pneumonia versus underlying malignancy. Suggest further evaluation with CT of the chest. Electronically Signed   By: 08/14/2021 M.D.   On: 08/24/2021 17:42   CT Chest Wo Contrast  Result Date: 08/24/2021 CLINICAL DATA:  Unresolved pneumonia. EXAM: CT CHEST WITHOUT CONTRAST TECHNIQUE: Multidetector CT imaging of the chest was performed following the standard protocol without IV contrast. COMPARISON:  03/11/2020 FINDINGS: Cardiovascular: Normal heart size. There is a left chest wall ICD with leads in the right atrial appendage, coronary sinus and right ventricle. Normal heart size. No pericardial effusion. Mediastinum/Nodes: Normal appearance of the thyroid gland. The trachea appears patent and is midline. Normal appearance of the esophagus. No enlarged supraclavicular or axillary lymph nodes. Prominent right  paratracheal lymph nodes are identified measuring up to 0.9 cm, image 66/2. None of these meet CT criteria for adenopathy. The hilar structures are suboptimally evaluated due to lack of IV contrast material. There is abrupt cut off of the right upper  lobe bronchus just beyond the takeoff, image 104/5. This is new when compared with remote study dated 02/14/2019 but appears similar to 03/11/20. Here, there is a suggestion of a small central soft tissue nodule measuring 1.1 cm, image 64/2. Lungs/Pleura: No pleural effusion. Extensive postobstructive changes are identified within the right upper lobe. Large cavitary lung mass is noted measuring 8.6 x 5.2 by 6.1 cm, image 54/3. these changes have progressed when compared with 03/11/2020 when there was only mild anterior basal and posterior basal subsegmental atelectasis. The left lung is clear. Upper Abdomen: No acute abnormality identified within the imaged portions of the upper abdomen. Low-density structure within posterior right hepatic lobe is unchanged from previous exam measuring 2.4 cm. Musculoskeletal: No chest wall mass or suspicious bone lesions identified. IMPRESSION: 1. Large cavitary lung mass within the right upper lobe is identified with surrounding postobstructive changes. Findings have progressed when compared with 03/11/2020 when there was only mild anterior basal and posterior basal subsegmental atelectasis. Differential considerations include inflammatory/infectious etiologies as well as neoplastic processes. 2. Abrupt cut off of the right upper lobe bronchus just beyond the takeoff. This is new when compared with remote study dated 02/14/2019 but appears similar to 03/11/20. Here, there is a suggestion of a small central soft tissue nodule measuring 1.1 cm. Chronic, indolent neoplastic process can not be excluded. Consider further evaluation with bronchoscopy. 3. Prominent right paratracheal lymph nodes are identified measuring up to 0.9 cm. None of these meet CT criteria for adenopathy. Attention on follow-up imaging is advised. 4. Aortic atherosclerosis. Aortic Atherosclerosis (ICD10-I70.0). Electronically Signed   By: Signa Kell M.D.   On: 08/24/2021 18:18   ASSESSMENT AND PLAN:   Patrick Figueroa. is a 85 y.o. M with hx of COPD, FEV1 24%, chronic bronchiectasis, complete heart block with PPM and ICD, and sCHF EF 40% who presents with 1 month fatigue, cough and developed hemoptysis  chest x-ray that showed a cavitary pneumonia  Necrotizing pneumonia  --Patient with persistent pneumonia, failed outpatient treatment with appropriate antibiotics.  -- Patient meets SIRS criteria but clinically is not septic, sepsis ruled out at admission.  --CT chest Large cavitary lung mass within the right upper lobe is identified with surrounding postobstructive changes. Findings have progressed when compared with 03/11/2020 when there was only mild anterior basal and posterior basal subsegmental atelectasis - Start vancomycin and Zosyn - Continue prednisone 10 mg daily - 9/7-- mild streaks of blood and sputum. Afebrile. White count improving. MRSA PCR negative, will DC vancomycin. Continue Zosyn. -- Pulmonary Dr.Aleskerov probably planning bronchoscopy - await sputum bacterial culture, fungal culture, AFB, Fungitell, cryptococcal antigen, Aspergillus antigen and Legionella urine antigen. --9/8--S/p Bronchoscopy--MUCOID PLUGGING OF RUL WAS IRRIGATED AND EVACUATED FROM RUL, PATIENT HAD 100% EDEMA AND OCCLUSION OF ANTERIOR, APICAL AND POSTERIOR SEGMENT OF RIGHT UPPER LOBE WITH WHAT APPEARED TO BE TUMOR/MALIGANCY.  CYTOBRUSH WAS PERFORMED AT RUL.  SURGICAL PATHOLOGY VIA FORCEPS BIOPSY WAS DONE X 3.    Acute kidney injury --Baseline Cr 1.1-1.2.  He received 1.75.   -- Creat trending down - Held [place], Mobic, diuretics/torsemide--now resume enteresto   COPD No evidence of flare - Give nebulized ICS/LABA - Continue Singulair   Complete heart block --Has history of pacemaker and ICD  Chronic systolic CHF Ischemic cardiomyopathy Appears euvolemic, no evidence of flare - Continue home aspirin - Continue Coreg - resume Entresto since creat improving  Procedures: Bronchoscopy Family  communication :none Consults :Pulm CODE STATUS: FULL DVT Prophylaxis : enoxaparin Level of care: Med-Surg Status is: Inpatient  Remains inpatient appropriate because:Inpatient level of care appropriate due to severity of illness  Dispo: The patient is from: Home              Anticipated d/c is to: Home              Patient currently is medically stable will monitor one more day post Bronk and hopefully discharge   Difficult to place patient No        TOTAL TIME TAKING CARE OF THIS PATIENT: 25 minutes.  >50% time spent on counselling and coordination of care  Note: This dictation was prepared with Dragon dictation along with smaller phrase technology. Any transcriptional errors that result from this process are unintentional.  Enedina Finner M.D    [facility]   CC: Primary care physician; Marguarite Arbour, MD Patient ID: Altamese Dilling., male   DOB: 1936/02/12, 85 y.o.   MRN: [id number]

## 2021-08-26 NOTE — Progress Notes (Signed)
Pulmonary Medicine          Date: 08/26/2021,   MRN# [id number] Patrick Figueroa. 1936/08/04     AdmissionWeight: 88.9 kg                 CurrentWeight: 88.9 kg   Reffering physician : Dr Jacqualine Code   CHIEF COMPLAINT:   Non-massive hemoptysis with right upper lobe necrotizing pneumonia and failure of outpatient therapy.   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  He was seen for acute onset respiratory distress with productive cough 08/11/21 and was prescribed antibiotics and prednisone after CXR done in clinic revealed cavitary lesion of RUL with signs of necrotizing pneumonia.  He did complete antibiotic and prednisone burst but did not imrprove with complaints of ongoing hemoptysis and sever dyspnea with cough. He came in to ER due to failure of outpatient therapy and in ED had preliminary blood work with leukocytosis, AKI with reduced GFR at 38.  He had CT chest done showing extensive consolidated infiltrate of RUL with cavitary component suggestive of ongoing pneumonia. PCCM consultation placed for additional evaluation and management.   08/25/21- patient clinically improved, labwork improved. We discussed bronchoscopy with bal tommorow. NPO tonight. Patient wishes to proceed with bronch asap.  08/26/21- patient is improved  he is NPO and has not eaten or drank anything and is ready to proceed with bronchoscopy.  He is on vancomycin and zosyn.    PAST MEDICAL HISTORY   Past Medical History:  Diagnosis Date  . Arthritis    knee  . Asthma   . Cardiomyopathy ([place])   . CHF (congestive heart failure) ([place])   . COPD (chronic obstructive pulmonary disease) ([facility])    emphysema  . Environmental allergies   . Hypertension    . Presence of permanent cardiac pacemaker      SURGICAL HISTORY   Past Surgical History:  Procedure Laterality Date  . CATARACT EXTRACTION W/PHACO Right 12/09/2015   Procedure: CATARACT EXTRACTION PHACO AND INTRAOCULAR LENS PLACEMENT ([place]);  Surgeon: Leandrew Koyanagi, MD;  Location: [facility];  Service: Ophthalmology;  Laterality: Right;  . CATARACT EXTRACTION W/PHACO Left 01/13/2016   Procedure: CATARACT EXTRACTION PHACO AND INTRAOCULAR LENS PLACEMENT ([facility]);  Surgeon: Leandrew Koyanagi, MD;  Location: [facility];  Service: Ophthalmology;  Laterality: Left;  . COLONOSCOPY    . INSERT / REPLACE / REMOVE PACEMAKER  09/29/08     FAMILY HISTORY   Family History  Problem Relation Age of Onset  . Asthma Father   . Heart attack Sister   . Asthma Brother   . Rheum arthritis Other      SOCIAL HISTORY   Social History   Tobacco Use  . Smoking status: Never  . Smokeless tobacco: Never  Vaping Use  . Vaping Use: Never used  Substance Use Topics  . Alcohol use: No  . Drug use: No     MEDICATIONS    Home Medication:  REM   Current Medication:  Current Facility-Administered Medications:  .  acetaminophen (TYLENOL) tablet 650 mg, 650 mg, Oral, Q6H PRN **OR** acetaminophen (TYLENOL) suppository 650 mg, 650 mg, Rectal, Q6H PRN, Danford, Suann Larry, MD .  aspirin EC tablet 81 mg, 81 mg,  Oral, Daily, Edwin Dada, MD, 81 mg at 08/25/21 0814 .  benzonatate (TESSALON) capsule 200 mg, 200 mg, Oral, TID, Danford, Suann Larry, MD, 200 mg at 08/25/21 2103 .  carvedilol (COREG) tablet 12.5 mg, 12.5 mg, Oral, BID WC, Danford, Suann Larry, MD, 12.5 mg at 08/25/21 1641 .  enoxaparin (LOVENOX) injection 40 mg, 40 mg, Subcutaneous, Q24H, Danford, Suann Larry, MD, 40 mg at 08/25/21 2103 .  guaiFENesin (MUCINEX) 12 hr tablet 600 mg, 600 mg, Oral, BID PRN, Danford, Suann Larry, MD .  influenza vaccine adjuvanted (FLUAD) injection 0.5 mL, 0.5 mL,  Intramuscular, Prior to discharge, Danford, Suann Larry, MD .  ketotifen (ZADITOR) 0.025 % ophthalmic solution 1 drop, 1 drop, Both Eyes, BID, Danford, Suann Larry, MD, 1 drop at 08/26/21 [phone]  loratadine (CLARITIN) tablet 10 mg, 10 mg, Oral, QPM, Danford, Suann Larry, MD, 10 mg at 08/25/21 1641 .  montelukast (SINGULAIR) tablet 10 mg, 10 mg, Oral, QPM, Danford, Suann Larry, MD, 10 mg at 08/25/21 1642 .  ondansetron (ZOFRAN) tablet 4 mg, 4 mg, Oral, Q6H PRN **OR** ondansetron (ZOFRAN) injection 4 mg, 4 mg, Intravenous, Q6H PRN, Danford, Christopher P, MD .  piperacillin-tazobactam (ZOSYN) IVPB 3.375 g, 3.375 g, Intravenous, Q8H, Dorothe Pea, RPH, Last Rate: 12.5 mL/hr at 08/26/21 0420, Infusion Verify at 08/26/21 0420 .  predniSONE (DELTASONE) tablet 10 mg, 10 mg, Oral, Q breakfast, Danford, Suann Larry, MD, 10 mg at 08/25/21 0808 .  psyllium (HYDROCIL/METAMUCIL) 1 packet, 1 packet, Oral, Daily, Fritzi Mandes, MD, 1 packet at 08/25/21 1821 .  sacubitril-valsartan (ENTRESTO) 24-26 mg per tablet, 1 tablet, Oral, BID, Fritzi Mandes, MD, 1 tablet at 08/25/21 1447    ALLERGIES   Patient has no known allergies.     REVIEW OF SYSTEMS    Review of Systems:  Gen:  Denies  fever, sweats, chills weigh loss  HEENT: Denies blurred vision, double vision, ear pain, eye pain, hearing loss, nose bleeds, sore throat Cardiac:  No dizziness, chest pain or heaviness, chest tightness,edema Resp:   Denies cough or sputum porduction, shortness of breath,wheezing, hemoptysis,  Gi: Denies swallowing difficulty, stomach pain, nausea or vomiting, diarrhea, constipation, bowel incontinence Gu:  Denies bladder incontinence, burning urine Ext:   Denies Joint pain, stiffness or swelling Skin: Denies  skin rash, easy bruising or bleeding or hives Endoc:  Denies polyuria, polydipsia , polyphagia or weight change Psych:   Denies depression, insomnia or hallucinations   Other:  All other systems  negative   VS: BP 106/73 (BP Location: Left Arm)   Pulse 65   Temp 98 F (36.7 C)   Resp 18   Ht _0  (1.753 m)   Wt 88.9 kg   SpO2 99%   BMI 28.94 kg/m      PHYSICAL EXAM    GENERAL:NAD, no fevers, chills, no weakness no fatigue HEAD: Normocephalic, atraumatic.  EYES: Pupils equal, round, reactive to light. Extraocular muscles intact. No scleral icterus.  MOUTH: Moist mucosal membrane. Dentition intact. No abscess noted.  EAR, NOSE, THROAT: Clear without exudates. No external lesions.  NECK: Supple. No thyromegaly. No nodules. No JVD.  PULMONARY: rhonchi at right upper lung zone CARDIOVASCULAR: S1 and S2. Regular rate and rhythm. No murmurs, rubs, or gallops. No edema. Pedal pulses 2+ bilaterally.  GASTROINTESTINAL: Soft, nontender, nondistended. No masses. Positive bowel sounds. No hepatosplenomegaly.  MUSCULOSKELETAL: No swelling, clubbing, or edema. Range of motion full in all extremities.  NEUROLOGIC: Cranial nerves II through XII are intact. No  gross focal neurological deficits. Sensation intact. Reflexes intact.  SKIN: No ulceration, lesions, rashes, or cyanosis. Skin warm and dry. Turgor intact.  PSYCHIATRIC: Mood, affect within normal limits. The patient is awake, alert and oriented x 3. Insight, judgment intact.       IMAGING    DG Chest 2 View  Result Date: 08/24/2021 CLINICAL DATA:  Chest pain EXAM: CHEST - 2 VIEW COMPARISON:  08/12/2021 FINDINGS: Left chest wall ICD noted with leads in the right atrial appendage, coronary sinus and right ventricle. Heart size appears normal. No pleural effusion identified. Right upper lobe cavitary process is identified measuring approximately 7.4 x 7.6 cm. Left lung appears clear. Visualized osseous structures appear intact. IMPRESSION: Right upper lobe cavitary process is identified. Findings may reflect cavitating pneumonia versus underlying malignancy. Suggest further evaluation with CT of the chest. Electronically Signed    By: Kerby Moors M.D.   On: 08/24/2021 17:42   CT Chest Wo Contrast  Result Date: 08/24/2021 CLINICAL DATA:  Unresolved pneumonia. EXAM: CT CHEST WITHOUT CONTRAST TECHNIQUE: Multidetector CT imaging of the chest was performed following the standard protocol without IV contrast. COMPARISON:  03/11/2020 FINDINGS: Cardiovascular: Normal heart size. There is a left chest wall ICD with leads in the right atrial appendage, coronary sinus and right ventricle. Normal heart size. No pericardial effusion. Mediastinum/Nodes: Normal appearance of the thyroid gland. The trachea appears patent and is midline. Normal appearance of the esophagus. No enlarged supraclavicular or axillary lymph nodes. Prominent right paratracheal lymph nodes are identified measuring up to 0.9 cm, image 66/2. None of these meet CT criteria for adenopathy. The hilar structures are suboptimally evaluated due to lack of IV contrast material. There is abrupt cut off of the right upper lobe bronchus just beyond the takeoff, image 104/5. This is new when compared with remote study dated 02/14/2019 but appears similar to 03/11/20. Here, there is a suggestion of a small central soft tissue nodule measuring 1.1 cm, image 64/2. Lungs/Pleura: No pleural effusion. Extensive postobstructive changes are identified within the right upper lobe. Large cavitary lung mass is noted measuring 8.6 x 5.2 by 6.1 cm, image 54/3. these changes have progressed when compared with 03/11/2020 when there was only mild anterior basal and posterior basal subsegmental atelectasis. The left lung is clear. Upper Abdomen: No acute abnormality identified within the imaged portions of the upper abdomen. Low-density structure within posterior right hepatic lobe is unchanged from previous exam measuring 2.4 cm. Musculoskeletal: No chest wall mass or suspicious bone lesions identified. IMPRESSION: 1. Large cavitary lung mass within the right upper lobe is identified with surrounding  postobstructive changes. Findings have progressed when compared with 03/11/2020 when there was only mild anterior basal and posterior basal subsegmental atelectasis. Differential considerations include inflammatory/infectious etiologies as well as neoplastic processes. 2. Abrupt cut off of the right upper lobe bronchus just beyond the takeoff. This is new when compared with remote study dated 02/14/2019 but appears similar to 03/11/20. Here, there is a suggestion of a small central soft tissue nodule measuring 1.1 cm. Chronic, indolent neoplastic process can not be excluded. Consider further evaluation with bronchoscopy. 3. Prominent right paratracheal lymph nodes are identified measuring up to 0.9 cm. None of these meet CT criteria for adenopathy. Attention on follow-up imaging is advised. 4. Aortic atherosclerosis. Aortic Atherosclerosis (ICD10-I70.0). Electronically Signed   By: Kerby Moors M.D.   On: 08/24/2021 18:18      ASSESSMENT/PLAN   Acute hypoxemic respiratory failure - present on admission  -  COVID19 negative   - will perform infectious workup for pneumonia -serum fungitell -legionella ab -strep pneumoniae ur AG -Histoplasma Ur Ag -sputum resp cultures -AFB sputum expectorated specimen -sputum cytology  -reviewed pertinent imaging with patient today - ESR, CRP -PT/OT  -may need bronchoscopic evaluation will discuss further with patient and wife  Non-massive hemoptysis    - cough suppresion with tessalon     - patient does not tolerate narcotics   Moderate persistent asthma   - albuterol  neb q6h and prednisone 10 mg for now   Chronic cylindrical Bronchiectasis  -Patient empirically on Zosyn and Vancomycin for pna  Chronic renal failure KDIGO 3   - judicious use of IVF    - reduction of non-essential nephrotoxins      -please encourage patient to use incentive spirometer few times each hour while hospitalized.        Thank you for allowing me to participate  in the care of this patient.  Total face to face encounter time for this patient visit was >45 min. >50% of the time was  spent in counseling and coordination of care.   Patient/Family are satisfied with care plan and all questions have been answered.  This document was prepared using Dragon voice recognition software and may include unintentional dictation errors.     Ottie Glazier, M.D.  Division of [facility]

## 2021-08-26 NOTE — Anesthesia Postprocedure Evaluation (Signed)
Anesthesia Post Note  Patient: Patrick Figueroa.  Procedure(s) Performed: VIDEO BRONCHOSCOPY (Bilateral)  Patient location during evaluation: Other (Recovered in room) Anesthesia Type: General Level of consciousness: awake and alert Pain management: pain level controlled Vital Signs Assessment: post-procedure vital signs reviewed and stable Respiratory status: spontaneous breathing, nonlabored ventilation and respiratory function stable Cardiovascular status: blood pressure returned to baseline and stable Postop Assessment: no apparent nausea or vomiting Anesthetic complications: no   No notable events documented.   Last Vitals:  Vitals:   08/26/21 0820 08/26/21 1338  BP: 106/73 (!) 93/56  Pulse: 65 89  Resp: 18 17  Temp: 36.7 C 36.8 C  SpO2: 99% 97%    Last Pain:  Vitals:   08/26/21 0244  TempSrc: Oral  PainSc:                  Foye Deer

## 2021-08-26 NOTE — Anesthesia Preprocedure Evaluation (Addendum)
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    Pulmonary exam normal        Cardiovascular Exercise Tolerance: Poor hypertension, +CHF (sCHF EF 40%)  Normal cardiovascular exam+ dysrhythmias (Complete heart block) + pacemaker + Cardiac Defibrillator  Rhythm:Regular Rate:Normal - Systolic murmurs    Neuro/Psych negative neurological ROS  negative psych ROS   GI/Hepatic negative GI ROS, Neg liver ROS,   Endo/Other  negative endocrine ROS  Renal/GU CRFRenal disease (Acute kidney injury on Chronic renal failure 3)     Musculoskeletal  (+) Arthritis , Osteoarthritis,    Abdominal Normal abdominal exam  (+)   Peds  Hematology  (+) anemia ,   Anesthesia Other Findings 1 month of fatigue, subsequently cough, hemoptysis, night sweats  Reproductive/Obstetrics                          Anesthesia Physical Anesthesia Plan  ASA: 4  Anesthesia Plan: General   Post-op Pain Management:    Induction: Intravenous  PONV Risk Score and Plan: 2 and Ondansetron, TIVA, Propofol infusion and Treatment may vary due to age or medical condition  Airway Management Planned: Oral ETT  Additional Equipment:   Intra-op Plan:   Post-operative Plan: Extubation in OR  Informed Consent: I have reviewed the patients History and Physical, chart, labs and discussed the procedure including  the risks, benefits and alternatives for the proposed anesthesia with the patient or authorized representative who has indicated his/her understanding and acceptance.     Dental advisory given  Plan Discussed with: CRNA and Anesthesiologist  Anesthesia Plan Comments:         Anesthesia Quick Evaluation

## 2021-08-26 NOTE — Anesthesia Procedure Notes (Signed)
Procedure Name: Intubation Date/Time: 08/26/2021 12:43 PM Performed by: Lynden Oxford, CRNA Pre-anesthesia Checklist: Patient identified, Emergency Drugs available, Suction available and Patient being monitored Patient Re-evaluated:Patient Re-evaluated prior to induction Oxygen Delivery Method: Circle system utilized Preoxygenation: Pre-oxygenation with 100% oxygen Induction Type: IV induction Ventilation: Mask ventilation without difficulty Laryngoscope Size: McGraph and 4 Grade View: Grade I Tube type: Oral Tube size: 8.5 mm Number of attempts: 1 Airway Equipment and Method: Stylet and Video-laryngoscopy Placement Confirmation: ETT inserted through vocal cords under direct vision, positive ETCO2 and breath sounds checked- equal and bilateral Secured at: 22 cm Tube secured with: Tape Dental Injury: Teeth and Oropharynx as per pre-operative assessment

## 2021-08-27 ENCOUNTER — Encounter: Payer: Self-pay | Admitting: Pulmonary Disease

## 2021-08-27 LAB — CULTURE, RESPIRATORY W GRAM STAIN
Culture: NORMAL
Gram Stain: NONE SEEN

## 2021-08-27 LAB — ACID FAST SMEAR (AFB, MYCOBACTERIA): Acid Fast Smear: NEGATIVE

## 2021-08-27 LAB — FUNGITELL, SERUM: Fungitell Result: <31 pg/mL (ref <80)

## 2021-08-27 LAB — KOH PREP: KOH Prep: NONE SEEN

## 2021-08-27 LAB — ASPERGILLUS ANTIGEN, BAL/SERUM

## 2021-08-27 MED ORDER — AMOXICILLIN-POT CLAVULANATE 875-125 MG PO TABS: 1.0000 | ORAL_TABLET | Freq: Two times a day (BID) | ORAL | 0 refills | Status: AC

## 2021-08-27 MED ORDER — AMOXICILLIN-POT CLAVULANATE 875-125 MG PO TABS
1.0000 | ORAL_TABLET | Freq: Two times a day (BID) | ORAL | Status: DC
  Administered 2021-08-27: 1 via ORAL
  Filled 2021-08-27: qty 1

## 2021-08-27 NOTE — Progress Notes (Signed)
Pulmonary Medicine          Date: 08/27/2021,   MRN# [id number] Patrick Figueroa. [birth year]     AdmissionWeight: 88.9 kg                 CurrentWeight: 88.9 kg   Reffering physician : Dr Jacqualine Code   CHIEF COMPLAINT:   Non-massive hemoptysis with right upper lobe necrotizing pneumonia and failure of outpatient therapy.   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  He was seen for acute onset respiratory distress with productive cough 08/11/21 and was prescribed antibiotics and prednisone after CXR done in clinic revealed cavitary lesion of RUL with signs of necrotizing pneumonia.  He did complete antibiotic and prednisone burst but did not imrprove with complaints of ongoing hemoptysis and sever dyspnea with cough. He came in to ER due to failure of outpatient therapy and in ED had preliminary blood work with leukocytosis, AKI with reduced GFR at 38.  He had CT chest done showing extensive consolidated infiltrate of RUL with cavitary component suggestive of ongoing pneumonia. PCCM consultation placed for additional evaluation and management.   08/25/21- patient clinically improved, labwork improved. We discussed bronchoscopy with bal tommorow. NPO tonight. Patient wishes to proceed with bronch asap.  08/26/21- patient is improved  he is NPO and has not eaten or drank anything and is ready to proceed with bronchoscopy.  He is on vancomycin and zosyn.   08/27/21- patient reports substantial improvement to baseline , states he is able to sleep supine without pain now.  Hemoptysis has resolved.  Bronchoscopic specimens in process.    PAST MEDICAL HISTORY   Past Medical History:  Diagnosis Date  . Arthritis    knee  . Asthma   .  Cardiomyopathy ([place])   . CHF (congestive heart failure) ([place])   . COPD (chronic obstructive pulmonary disease) ([facility])    emphysema  . Environmental allergies   . Hypertension   . Presence of permanent cardiac pacemaker      SURGICAL HISTORY   Past Surgical History:  Procedure Laterality Date  . CATARACT EXTRACTION W/PHACO Right 12/09/2015   Procedure: CATARACT EXTRACTION PHACO AND INTRAOCULAR LENS PLACEMENT ([place]);  Surgeon: Leandrew Koyanagi, MD;  Location: [facility];  Service: Ophthalmology;  Laterality: Right;  . CATARACT EXTRACTION W/PHACO Left 01/13/2016   Procedure: CATARACT EXTRACTION PHACO AND INTRAOCULAR LENS PLACEMENT ([facility]);  Surgeon: Leandrew Koyanagi, MD;  Location: [facility];  Service: Ophthalmology;  Laterality: Left;  . COLONOSCOPY    . INSERT / REPLACE / REMOVE PACEMAKER  09/29/08  . VIDEO BRONCHOSCOPY Bilateral 08/26/2021   Procedure: VIDEO BRONCHOSCOPY;  Surgeon: Ottie Glazier, MD;  Location: [facility];  Service: Thoracic;  Laterality: Bilateral;     FAMILY HISTORY   Family History  Problem Relation Age of Onset  . Asthma Father   . Heart attack Sister   . Asthma Brother   . Rheum arthritis Other      SOCIAL HISTORY   Social History   Tobacco Use  . Smoking status: Never  . Smokeless tobacco: Never  Vaping Use  . Vaping Use: Never used  Substance Use Topics  . Alcohol use: No  . Drug use: No  MEDICATIONS    Home Medication:  Current Outpatient Rx  . Order #: [id number] Class: Normal     Current Medication:  Current Facility-Administered Medications:  .  acetaminophen (TYLENOL) tablet 650 mg, 650 mg, Oral, Q6H PRN **OR** acetaminophen (TYLENOL) suppository 650 mg, 650 mg, Rectal, Q6H PRN, Danford, Suann Larry, MD .  amoxicillin-clavulanate (AUGMENTIN) 875-125 MG per tablet 1 tablet, 1 tablet, Oral, Q12H, Fritzi Mandes, MD, 1 tablet at 08/27/21 0854 .  aspirin EC tablet 81 mg, 81 mg, Oral, Daily, Danford, Suann Larry, MD, 81 mg at 08/27/21 0853 .  benzonatate (TESSALON) capsule 200 mg, 200 mg, Oral, TID, Danford, Suann Larry, MD, 200 mg at 08/27/21 0852 .  carvedilol (COREG) tablet 12.5 mg, 12.5 mg, Oral, BID WC, Danford, Suann Larry, MD, 12.5 mg at 08/27/21 0852 .  enoxaparin (LOVENOX) injection 40 mg, 40 mg, Subcutaneous, Q24H, Danford, Suann Larry, MD, 40 mg at 08/26/21 2052 .  guaiFENesin (MUCINEX) 12 hr tablet 600 mg, 600 mg, Oral, BID PRN, Danford, Suann Larry, MD .  influenza vaccine adjuvanted (FLUAD) injection 0.5 mL, 0.5 mL, Intramuscular, Prior to discharge, Danford, Suann Larry, MD .  ketotifen (ZADITOR) 0.025 % ophthalmic solution 1 drop, 1 drop, Both Eyes, BID, Danford, Suann Larry, MD, 1 drop at 08/27/21 0854 .  loratadine (CLARITIN) tablet 10 mg, 10 mg, Oral, QPM, Danford, Suann Larry, MD, 10 mg at 08/26/21 1709 .  montelukast (SINGULAIR) tablet 10 mg, 10 mg, Oral, QPM, Danford, Suann Larry, MD, 10 mg at 08/26/21 1709 .  ondansetron (ZOFRAN) tablet 4 mg, 4 mg, Oral, Q6H PRN **OR** ondansetron (ZOFRAN) injection 4 mg, 4 mg, Intravenous, Q6H PRN, Danford, Christopher P, MD .  predniSONE (DELTASONE) tablet 10 mg, 10 mg, Oral, Q breakfast, Danford, Suann Larry, MD, 10 mg at 08/27/21 0852 .  psyllium (HYDROCIL/METAMUCIL) 1 packet, 1 packet, Oral, Daily, Fritzi Mandes, MD, 1 packet at 08/27/21 [phone] .  sacubitril-valsartan (ENTRESTO) 24-26 mg per tablet, 1 tablet, Oral, BID, Fritzi Mandes, MD, 1 tablet at 08/27/21 2952    ALLERGIES   Patient has no known allergies.     REVIEW OF SYSTEMS    Review of Systems:  Gen:  Denies  fever, sweats, chills weigh loss  HEENT: Denies blurred vision, double vision, ear pain, eye pain, hearing loss, nose bleeds, sore throat Cardiac:  No dizziness, chest pain or heaviness, chest tightness,edema Resp:   Denies cough or sputum porduction, shortness of breath,wheezing, hemoptysis,  Gi: Denies swallowing difficulty, stomach pain, nausea or  vomiting, diarrhea, constipation, bowel incontinence Gu:  Denies bladder incontinence, burning urine Ext:   Denies Joint pain, stiffness or swelling Skin: Denies  skin rash, easy bruising or bleeding or hives Endoc:  Denies polyuria, polydipsia , polyphagia or weight change Psych:   Denies depression, insomnia or hallucinations   Other:  All other systems negative   VS: BP 96/70 (BP Location: Left Arm)   Pulse 76   Temp 98.5 F (36.9 C)   Resp 18   Ht _0  (1.753 m)   Wt 88.9 kg   SpO2 98%   BMI 28.94 kg/m      PHYSICAL EXAM    GENERAL:NAD, no fevers, chills, no weakness no fatigue HEAD: Normocephalic, atraumatic.  EYES: Pupils equal, round, reactive to light. Extraocular muscles intact. No scleral icterus.  MOUTH: Moist mucosal membrane. Dentition intact. No abscess noted.  EAR, NOSE, THROAT: Clear without exudates. No external lesions.  NECK: Supple. No thyromegaly. No nodules. No JVD.  PULMONARY: rhonchi at right  upper lung zone CARDIOVASCULAR: S1 and S2. Regular rate and rhythm. No murmurs, rubs, or gallops. No edema. Pedal pulses 2+ bilaterally.  GASTROINTESTINAL: Soft, nontender, nondistended. No masses. Positive bowel sounds. No hepatosplenomegaly.  MUSCULOSKELETAL: No swelling, clubbing, or edema. Range of motion full in all extremities.  NEUROLOGIC: Cranial nerves II through XII are intact. No gross focal neurological deficits. Sensation intact. Reflexes intact.  SKIN: No ulceration, lesions, rashes, or cyanosis. Skin warm and dry. Turgor intact.  PSYCHIATRIC: Mood, affect within normal limits. The patient is awake, alert and oriented x 3. Insight, judgment intact.       IMAGING    DG Chest 2 View  Result Date: 08/24/2021 CLINICAL DATA:  Chest pain EXAM: CHEST - 2 VIEW COMPARISON:  08/12/2021 FINDINGS: Left chest wall ICD noted with leads in the right atrial appendage, coronary sinus and right ventricle. Heart size appears normal. No pleural effusion  identified. Right upper lobe cavitary process is identified measuring approximately 7.4 x 7.6 cm. Left lung appears clear. Visualized osseous structures appear intact. IMPRESSION: Right upper lobe cavitary process is identified. Findings may reflect cavitating pneumonia versus underlying malignancy. Suggest further evaluation with CT of the chest. Electronically Signed   By: Kerby Moors M.D.   On: 08/24/2021 17:42   CT Chest Wo Contrast  Result Date: 08/24/2021 CLINICAL DATA:  Unresolved pneumonia. EXAM: CT CHEST WITHOUT CONTRAST TECHNIQUE: Multidetector CT imaging of the chest was performed following the standard protocol without IV contrast. COMPARISON:  03/11/2020 FINDINGS: Cardiovascular: Normal heart size. There is a left chest wall ICD with leads in the right atrial appendage, coronary sinus and right ventricle. Normal heart size. No pericardial effusion. Mediastinum/Nodes: Normal appearance of the thyroid gland. The trachea appears patent and is midline. Normal appearance of the esophagus. No enlarged supraclavicular or axillary lymph nodes. Prominent right paratracheal lymph nodes are identified measuring up to 0.9 cm, image 66/2. None of these meet CT criteria for adenopathy. The hilar structures are suboptimally evaluated due to lack of IV contrast material. There is abrupt cut off of the right upper lobe bronchus just beyond the takeoff, image 104/5. This is new when compared with remote study dated 02/14/2019 but appears similar to 03/11/20. Here, there is a suggestion of a small central soft tissue nodule measuring 1.1 cm, image 64/2. Lungs/Pleura: No pleural effusion. Extensive postobstructive changes are identified within the right upper lobe. Large cavitary lung mass is noted measuring 8.6 x 5.2 by 6.1 cm, image 54/3. these changes have progressed when compared with 03/11/2020 when there was only mild anterior basal and posterior basal subsegmental atelectasis. The left lung is clear. Upper  Abdomen: No acute abnormality identified within the imaged portions of the upper abdomen. Low-density structure within posterior right hepatic lobe is unchanged from previous exam measuring 2.4 cm. Musculoskeletal: No chest wall mass or suspicious bone lesions identified. IMPRESSION: 1. Large cavitary lung mass within the right upper lobe is identified with surrounding postobstructive changes. Findings have progressed when compared with 03/11/2020 when there was only mild anterior basal and posterior basal subsegmental atelectasis. Differential considerations include inflammatory/infectious etiologies as well as neoplastic processes. 2. Abrupt cut off of the right upper lobe bronchus just beyond the takeoff. This is new when compared with remote study dated 02/14/2019 but appears similar to 03/11/20. Here, there is a suggestion of a small central soft tissue nodule measuring 1.1 cm. Chronic, indolent neoplastic process can not be excluded. Consider further evaluation with bronchoscopy. 3. Prominent right paratracheal lymph nodes are  identified measuring up to 0.9 cm. None of these meet CT criteria for adenopathy. Attention on follow-up imaging is advised. 4. Aortic atherosclerosis. Aortic Atherosclerosis (ICD10-I70.0). Electronically Signed   By: Kerby Moors M.D.   On: 08/24/2021 18:18      ASSESSMENT/PLAN   Acute hypoxemic respiratory failure - present on admission  - COVID19 negative   - will perform infectious workup for pneumonia -serum fungitell -legionella ab -strep pneumoniae ur AG -Histoplasma Ur Ag -sputum resp cultures -AFB sputum expectorated specimen -sputum cytology  -reviewed pertinent imaging with patient today - ESR, CRP -PT/OT  -may need bronchoscopic evaluation will discuss further with patient and wife  Non-massive hemoptysis    - cough suppresion with tessalon     - patient does not tolerate narcotics   Moderate persistent asthma   - albuterol  neb q6h and prednisone  10 mg for now   Chronic cylindrical Bronchiectasis  -Patient empirically on Zosyn and Vancomycin for pna  Chronic renal failure KDIGO 3   - judicious use of IVF    - reduction of non-essential nephrotoxins      -please encourage patient to use incentive spirometer few times each hour while hospitalized.        Thank you for allowing me to participate in the care of this patient.  Total face to face encounter time for this patient visit was >45 min. >50% of the time was  spent in counseling and coordination of care.   Patient/Family are satisfied with care plan and all questions have been answered.  This document was prepared using Dragon voice recognition software and may include unintentional dictation errors.     Ottie Glazier, M.D.  Division of [facility]

## 2021-08-27 NOTE — Care Management Important Message (Signed)
Important Message  Patient Details  Name: Patrick Figueroa. MRN: [id number] Date of Birth: 1936-11-25   Medicare Important Message Given:  Yes - Important Message mailed due to current National Emergency  Reviewed Medicare IM with patient via room phone due to isolation status.  Copy of Medicare IM mailed to patient address on file.    Johnell Comings 08/27/2021, 10:43 AM

## 2021-08-27 NOTE — Progress Notes (Signed)
Patient discharged to home via wheelchair, accompanied by wife.  Patient discharged with all pertinent information, prescriptions and personal belongings.  Patient/wife able to teach back discharge instructions.  IV's D/ced.  No acute distress noted. Care relinquished.

## 2021-08-27 NOTE — Discharge Summary (Signed)
[facility] NAME: Patrick Figueroa    MR#:  [id number]  DATE OF BIRTH:  [birth year]  DATE OF ADMISSION:  08/24/2021 ADMITTING PHYSICIAN: Edwin Dada, MD  DATE OF DISCHARGE: 08/27/2021  PRIMARY CARE PHYSICIAN: Idelle Crouch, MD    ADMISSION DIAGNOSIS:  Necrotic pneumonia ([place]) [J85.0] Community acquired pneumonia, unspecified laterality [J18.9]  DISCHARGE DIAGNOSIS:  Right UL cavitary lung suspect post obstructive  pneumonia s/p Bronc with pending biopsy results  SECONDARY DIAGNOSIS:   Past Medical History:  Diagnosis Date  . Arthritis    knee  . Asthma   . Cardiomyopathy ([place])   . CHF (congestive heart failure) ([place])   . COPD (chronic obstructive pulmonary disease) ([facility])    emphysema  . Environmental allergies   . Hypertension   . Presence of permanent cardiac pacemaker     HOSPITAL COURSE:  Patrick Figueroa. is a 85 y.o. M with hx of COPD, FEV1 24%, chronic bronchiectasis, complete heart block with PPM and ICD, and sCHF EF 40% who presents with 1 month fatigue, cough and developed hemoptysis  chest x-ray that showed a cavitary pneumonia   Necrotizing pneumonia  --Patient with persistent pneumonia, failed outpatient treatment with appropriate antibiotics.  -- Patient meets SIRS criteria but clinically is not septic, sepsis ruled out at admission.  --CT chest Large cavitary lung mass within the right upper lobe is identified with surrounding postobstructive changes. Findings have progressed when compared with 03/11/2020 when there was only mild anterior basal and posterior basal subsegmental atelectasis - Start vancomycin and Zosyn - Continue prednisone 10 mg daily - 9/7-- mild streaks of blood and sputum. Afebrile. White count improving. MRSA PCR negative, will DC vancomycin. Continue Zosyn. -- Pulmonary Dr.Aleskerov probably planning bronchoscopy - await sputum bacterial culture, fungal culture, AFB, Fungitell,  cryptococcal antigen, Aspergillus antigen and Legionella urine antigen. --9/8--S/p Bronchoscopy--MUCOID PLUGGING OF RUL WAS IRRIGATED AND EVACUATED FROM RUL, PATIENT HAD 100% EDEMA AND OCCLUSION OF ANTERIOR, APICAL AND POSTERIOR SEGMENT OF RIGHT UPPER LOBE WITH WHAT APPEARED TO BE TUMOR/MALIGANCY.  CYTOBRUSH WAS PERFORMED AT RUL.  SURGICAL PATHOLOGY VIA FORCEPS BIOPSY WAS DONE X 3.  --9/9--feels ok. No resp distress. Change to po Augmentin. Patient will follow-up Dr.A luminary as outpatient for Bronch biopsy results and remaining culture results sent outpatient lab. Will discharge patient to home. Pulmonary is agreeable.   Acute kidney injury --Baseline Cr 1.1-1.2.  He received 1.75.   -- Creat trending down - Held [place], Mobic, diuretics/torsemide--now resume enteresto   COPD No evidence of flare - Give nebulized ICS/LABA - Continue Singulair   Complete heart block --Has history of pacemaker and ICD   Chronic systolic CHF Ischemic cardiomyopathy Appears euvolemic - Continue home aspirin - Continue Coreg - resume Entresto since creat improving   Procedures: Bronchoscopy Family communication :none Consults :Pulm CODE STATUS: FULL DVT Prophylaxis : enoxaparin Level of care: Med-Surg Status is: Inpatient   Remains inpatient appropriate because:Inpatient level of care appropriate due to severity of illness   Dispo: The patient is from: Home              Anticipated d/c is to: Home today              Patient currently is medically stable                 CONSULTS OBTAINED:  Treatment Team:  Ottie Glazier, MD  DRUG ALLERGIES:  No Known Allergies  DISCHARGE MEDICATIONS:  Allergies as of 08/27/2021   No Known Allergies      Medication List     STOP taking these medications    torsemide 20 MG tablet Commonly known as: DEMADEX       TAKE these medications    acetaminophen 325 MG tablet Commonly known as: TYLENOL Take 500 mg by mouth every 6 (six) hours as  needed for mild pain.   amoxicillin-clavulanate 875-125 MG tablet Commonly known as: AUGMENTIN Take 1 tablet by mouth every 12 (twelve) hours for 5 days.   aspirin 81 MG tablet Take 81 mg by mouth daily. AM   azelastine 0.05 % ophthalmic solution Commonly known as: OPTIVAR Place 1 drop into both eyes 2 (two) times daily as needed (allergies).   carvedilol 12.5 MG tablet Commonly known as: COREG Take 12.5 mg by mouth 2 (two) times daily with a meal.   Entresto 24-26 MG Generic drug: sacubitril-valsartan Take 1 tablet by mouth 2 (two) times daily.   fluticasone 50 MCG/ACT nasal spray Commonly known as: FLONASE Place 1 spray into both nostrils daily. PM   guaiFENesin 600 MG 12 hr tablet Commonly known as: MUCINEX Take 600 mg by mouth 2 (two) times daily as needed for to loosen phlegm.   levocetirizine 5 MG tablet Commonly known as: XYZAL Take 5 mg by mouth every evening.   meloxicam 15 MG tablet Commonly known as: MOBIC Take 15 mg by mouth daily as needed for pain.   montelukast 10 MG tablet Commonly known as: SINGULAIR Take 10 mg by mouth every evening.        If you experience worsening of your admission symptoms, develop shortness of breath, life threatening emergency, suicidal or homicidal thoughts you must seek medical attention immediately by calling 911 or calling your MD immediately  if symptoms less severe.  You Must read complete instructions/literature along with all the possible adverse reactions/side effects for all the Medicines you take and that have been prescribed to you. Take any new Medicines after you have completely understood and accept all the possible adverse reactions/side effects.   Please note  You were cared for by a hospitalist during your hospital stay. If you have any questions about your discharge medications or the care you received while you were in the hospital after you are discharged, you can call the unit and asked to speak with  the hospitalist on call if the hospitalist that took care of you is not available. Once you are discharged, your primary care physician will handle any further medical issues. Please note that NO REFILLS for any discharge medications will be authorized once you are discharged, as it is imperative that you return to your primary care physician (or establish a relationship with a primary care physician if you do not have one) for your aftercare needs so that they can reassess your need for medications and monitor your lab values. Today   SUBJECTIVE   patient sitting out in the chair. Denies any other complaints.  VITAL SIGNS:  Blood pressure 96/70, pulse 76, temperature 98.5 F (36.9 C), resp. rate 18, height _0  (1.753 m), weight 88.9 kg, SpO2 98 %.  I/O:   Intake/Output Summary (Last 24 hours) at 08/27/2021 0831 Last data filed at 08/27/2021 0546 Gross per 24 hour  Intake 279.89 ml  Output 900 ml  Net -620.11 ml    PHYSICAL EXAMINATION:  GENERAL:  85 y.o.-year-old patient lying in the bed with no acute distress.  LUNGS: Normal breath sounds  bilaterally, no wheezing, rales,rhonchi or crepitation. No use of accessory muscles of respiration.  CARDIOVASCULAR: S1, S2 normal. No murmurs, rubs, or gallops.  ABDOMEN: Soft, non-tender, non-distended. Bowel sounds present. No organomegaly or mass.  EXTREMITIES: No pedal edema, cyanosis, or clubbing.  NEUROLOGIC: non focal PSYCHIATRIC: The patient is alert and oriented x 3.  SKIN: No obvious rash, lesion, or ulcer.   DATA REVIEW:   CBC  Recent Labs  Lab 08/25/21 0416  WBC 11.1*  HGB 11.0*  HCT 32.4*  PLT 257    Chemistries  Recent Labs  Lab 08/25/21 0416  NA 133*  K 3.9  CL 102  CO2 24  GLUCOSE 133*  BUN 22  CREATININE 1.31*  CALCIUM 8.3*    Microbiology Results   Recent Results (from the past 240 hour(s))  Expectorated Sputum Assessment w Gram Stain, Rflx to Resp Cult     Status: None   Collection Time: 08/24/21  5:57  PM   Specimen: Sputum  Result Value Ref Range Status   Specimen Description SPUTUM  Final   Special Requests NONE  Final   Sputum evaluation   Final    THIS SPECIMEN IS ACCEPTABLE FOR SPUTUM CULTURE Performed at [facility], [address] [place], [place] [address]    Report Status 08/24/2021 FINAL  Final  Acid Fast Smear (AFB)     Status: None   Collection Time: 08/24/21  5:57 PM   Specimen: Sputum  Result Value Ref Range Status   AFB Specimen Processing Concentration  Final   Acid Fast Smear Negative  Final    Comment: (NOTE) Performed At: [facility] [address] [place], [place] [id number] Rush Farmer MD YK:[id number]    Source (AFB) SPUTUM  Final    Comment: Performed at [facility], [address] [place], [place] [address]  Resp Panel by RT-PCR (Flu A&B, Covid) Nasopharyngeal Swab     Status: None   Collection Time: 08/24/21  5:57 PM   Specimen: Nasopharyngeal Swab; Nasopharyngeal(NP) swabs in vial transport medium  Result Value Ref Range Status   SARS Coronavirus 2 by RT PCR NEGATIVE NEGATIVE Final    Comment: (NOTE) SARS-CoV-2 target nucleic acids are NOT DETECTED.  The SARS-CoV-2 RNA is generally detectable in upper respiratory specimens during the acute phase of infection. The lowest concentration of SARS-CoV-2 viral copies this assay can detect is 138 copies/mL. A negative result does not preclude SARS-Cov-2 infection and should not be used as the sole basis for treatment or other patient management decisions. A negative result may occur with  improper specimen collection/handling, submission of specimen other than nasopharyngeal swab, presence of viral mutation(s) within the areas targeted by this assay, and inadequate number of viral copies(<138 copies/mL). A negative result must be combined with clinical observations, patient history, and epidemiological information. The expected result is Negative.  Fact Sheet for  Patients:  EntrepreneurPulse.com.au  Fact Sheet for Healthcare Providers:  IncredibleEmployment.be  This test is no t yet approved or cleared by the [place] FDA and  has been authorized for detection and/or diagnosis of SARS-CoV-2 by FDA under an Emergency Use Authorization (EUA). This EUA will remain  in effect (meaning this test can be used) for the duration of the COVID-19 declaration under Section 564(b)(1) of the Act, 21 U.S.C.section 360bbb-3(b)(1), unless the authorization is terminated  or revoked sooner.       Influenza A by PCR NEGATIVE NEGATIVE Final   Influenza B by PCR NEGATIVE NEGATIVE Final  Comment: (NOTE) The Xpert Xpress SARS-CoV-2/FLU/RSV plus assay is intended as an aid in the diagnosis of influenza from Nasopharyngeal swab specimens and should not be used as a sole basis for treatment. Nasal washings and aspirates are unacceptable for Xpert Xpress SARS-CoV-2/FLU/RSV testing.  Fact Sheet for Patients: EntrepreneurPulse.com.au  Fact Sheet for Healthcare Providers: IncredibleEmployment.be  This test is not yet approved or cleared by the [place] FDA and has been authorized for detection and/or diagnosis of SARS-CoV-2 by FDA under an Emergency Use Authorization (EUA). This EUA will remain in effect (meaning this test can be used) for the duration of the COVID-19 declaration under Section 564(b)(1) of the Act, 21 U.S.C. section 360bbb-3(b)(1), unless the authorization is terminated or revoked.  Performed at [facility], [address] [place], [place] [address]   Culture, blood (Routine X 2) w Reflex to ID Panel     Status: None (Preliminary result)   Collection Time: 08/24/21  5:57 PM   Specimen: BLOOD  Result Value Ref Range Status   Specimen Description BLOOD BLOOD RIGHT ARM  Final   Special Requests   Final    BOTTLES DRAWN AEROBIC AND ANAEROBIC Blood  Culture adequate volume   Culture   Final    NO GROWTH 3 DAYS Performed at [facility], [address], [place], [place] [address]    Report Status PENDING  Incomplete  Culture, blood (Routine X 2) w Reflex to ID Panel     Status: None (Preliminary result)   Collection Time: 08/24/21  5:57 PM   Specimen: BLOOD  Result Value Ref Range Status   Specimen Description BLOOD BLOOD LEFT ARM  Final   Special Requests   Final    BOTTLES DRAWN AEROBIC AND ANAEROBIC Blood Culture adequate volume   Culture   Final    NO GROWTH 3 DAYS Performed at [facility], [address] [place], [place] [address]    Report Status PENDING  Incomplete  Culture, fungus without smear     Status: None (Preliminary result)   Collection Time: 08/24/21  5:57 PM   Specimen: Sputum; Other  Result Value Ref Range Status   Specimen Description   Final    SPU Performed at [facility], [address] [place], [place] [address]    Special Requests   Final    NONE Performed at [facility], [address], [place], [place] [address]    Culture   Final    NO GROWTH 1 DAY Performed at [facility], [place] [address], [place], [place] [address]    Report Status PENDING  Incomplete  Culture, Respiratory w Gram Stain     Status: None   Collection Time: 08/24/21  5:57 PM   Specimen: SPU  Result Value Ref Range Status   Specimen Description   Final    SPUTUM Performed at [facility], [address] [place], [place] [address]    Special Requests   Final    NONE Reflexed from [id number] Performed at [facility], [address] [place], [place] [address]    Gram Stain NO WBC SEEN NO ORGANISMS SEEN   Final   Culture   Final    Normal respiratory flora-no Staph aureus or Pseudomonas seen Performed at [facility], [place] [address], [place],  [address]    Report Status 08/27/2021 FINAL  Final  MRSA Next Gen by PCR, Nasal     Status: None  Collection Time: 08/24/21 10:00 PM   Specimen: Nasal Mucosa; Nasal Swab  Result Value Ref Range Status   MRSA by PCR Next Gen NOT DETECTED NOT DETECTED Final    Comment: (NOTE) The GeneXpert MRSA Assay (FDA approved for NASAL specimens only), is one component of a comprehensive MRSA colonization surveillance program. It is not intended to diagnose MRSA infection nor to guide or monitor treatment for MRSA infections. Test performance is not FDA approved in patients less than 65 years old. Performed at [facility], [address] [place], [place] [address]   Culture, BAL-quantitative w Gram Stain     Status: None (Preliminary result)   Collection Time: 08/26/21  1:25 PM   Specimen: Bronchoalveolar Lavage; Respiratory  Result Value Ref Range Status   Specimen Description   Final    BRONCHIAL ALVEOLAR LAVAGE Performed at [facility], [address] [place], [place] [address]    Special Requests   Final    Immunocompromised Performed at [facility], [address] [place], [place] [address]    Gram Stain   Final    NO SQUAMOUS EPITHELIAL CELLS SEEN NO WBC SEEN NO ORGANISMS SEEN    Culture   Final    NO GROWTH < 24 HOURS Performed at [facility], [place] [address] [place], [place] [address]    Report Status PENDING  Incomplete    RADIOLOGY:  No results found.   CODE STATUS:     Code Status Orders  (From admission, onward)           Start     Ordered   08/24/21 1941  Full code  Continuous        08/24/21 1940           Code Status History     Date Active Date Inactive Code Status Order ID Comments User Context   05/14/2018 1633 05/16/2018 1702 Full Code [id number]  SalaryAvel Peace, MD ED      Advance Directive Documentation    Flowsheet Row Most Recent Value  Type of Advance Directive Healthcare Power of Attorney  Pre-existing out of facility DNR order (yellow form or pink MOST form) --  "MOST" Form in Place?  --        TOTAL TIME TAKING CARE OF THIS PATIENT: 40 minutes.    Fritzi Mandes M.D  [facility]    CC: Primary care physician; Idelle Crouch, MD

## 2021-08-27 NOTE — Progress Notes (Signed)
Mobility Specialist - Progress Note   08/27/21 1001  Mobility  Activity Ambulated in room  Level of Assistance Independent  Assistive Device None  Distance Ambulated (ft) 40 ft  Mobility Ambulated independently in room  Mobility Response Tolerated well  Mobility performed by Mobility specialist  $Mobility charge 1 Mobility    During mobility: 96 HR, 96% SpO2   Pt received in chair. Ambulated in room independently. No complaints. Reports feeling really great this date, anticipating d/c.    Filiberto Pinks Mobility Specialist 08/27/21, 10:03 AM

## 2021-08-28 LAB — CULTURE, BAL-QUANTITATIVE W GRAM STAIN
Culture: NO GROWTH
Gram Stain: NONE SEEN

## 2021-08-29 LAB — CULTURE, BLOOD (ROUTINE X 2)
Culture: NO GROWTH
Culture: NO GROWTH
Special Requests: ADEQUATE
Special Requests: ADEQUATE

## 2021-08-30 LAB — SURGICAL PATHOLOGY

## 2021-08-30 LAB — CYTOLOGY - NON PAP

## 2021-08-31 LAB — ANAEROBIC CULTURE W GRAM STAIN

## 2021-09-01 LAB — ASPERGILLUS ANTIGEN, BAL/SERUM: Aspergillus Ag, BAL/Serum: 0.03 Index (ref 0.00–0.49)

## 2021-09-08 ENCOUNTER — Inpatient Hospital Stay: Payer: Medicare Other

## 2021-09-08 ENCOUNTER — Encounter: Payer: Self-pay | Admitting: Oncology

## 2021-09-08 ENCOUNTER — Other Ambulatory Visit: Payer: Self-pay

## 2021-09-08 ENCOUNTER — Inpatient Hospital Stay: Payer: Medicare Other | Attending: Oncology | Admitting: Oncology

## 2021-09-08 ENCOUNTER — Telehealth: Payer: Self-pay | Admitting: *Deleted

## 2021-09-08 ENCOUNTER — Telehealth: Payer: Self-pay | Admitting: Oncology

## 2021-09-08 ENCOUNTER — Other Ambulatory Visit: Payer: Self-pay | Admitting: *Deleted

## 2021-09-08 VITALS — BP 123/84 | HR 73 | Temp 98.4°F | Resp 16 | Wt 201.8 lb

## 2021-09-08 DIAGNOSIS — I442 Atrioventricular block, complete: Secondary | ICD-10-CM | POA: Insufficient documentation

## 2021-09-08 DIAGNOSIS — Z79899 Other long term (current) drug therapy: Secondary | ICD-10-CM | POA: Diagnosis not present

## 2021-09-08 DIAGNOSIS — R918 Other nonspecific abnormal finding of lung field: Secondary | ICD-10-CM | POA: Diagnosis present

## 2021-09-08 LAB — PNEUMOCYSTIS PCR

## 2021-09-08 NOTE — Telephone Encounter (Signed)
Called the wife cell phone and the wife fgave the phone to the pt. I told him that Dr. Smith Robert did get on the phone with the radiologist and they still feel that it is a infection and wanted to do ct chest without contrast in October 3rd week and then see Dr. Smith Robert later in week. He is agreeable and I let jennifer know and then she will call pt and mail the appts also. Pt agreeable to the plan

## 2021-09-08 NOTE — Progress Notes (Signed)
Scan for lung nodule

## 2021-09-08 NOTE — Telephone Encounter (Signed)
Left VM with patient to make him aware of CT scan and MD followup scheduled. Provided him with day/time/instructions and location. Mailing AVS today.

## 2021-09-09 NOTE — Progress Notes (Signed)
Hematology/Oncology Consult note [facility] [phone] [phone]  Patient Care Team: Marguarite Arbour, MD as PCP - General (Internal Medicine)   Name of the patient: Patrick Figueroa  [id number]  01/11/1936    Reason for referral- lung mass   Referring physician- Dr. Karna Christmas  Date of visit: 09/09/21   History of presenting illness- Patient is a 85 year old male never smoker who was admitted to the hospital in September 2022 with symptoms of progressive fatigue cough and hemoptysis.  He had a CT chest done in the inpatient setting which showed a large cavitary lung mass in the right upper lobe with postobstructive changes.  Overall these changes have progressed as compared to his prior scan in March 2021.  He was treated presumptively for bacterial pneumonia with IV antibiotics and discharged on oral antibiotics.  Dr. Evlyn Clines also performed bronchoscopy and pathology was negative for malignancy.  Pneumocystis PCR negative.  Aspergillus negative.  Acid-fast bacilli negative.  He has been referred for further management.  Patient completed his course of antibiotics about a week ago and overall reports improvement in his symptoms including cough and shortness of breath.  Reports no present hemoptysis.  Appetite is improved since hospitalization.  ECOG PS- 1  Pain scale- 0-   Review of systems- Review of Systems  Constitutional:  Positive for malaise/fatigue. Negative for chills, fever and weight loss.  HENT:  Negative for congestion, ear discharge and nosebleeds.   Eyes:  Negative for blurred vision.  Respiratory:  Negative for cough, hemoptysis, sputum production, shortness of breath and wheezing.   Cardiovascular:  Negative for chest pain, palpitations, orthopnea and claudication.  Gastrointestinal:  Negative for abdominal pain, blood in stool, constipation, diarrhea, heartburn, melena, nausea and vomiting.  Genitourinary:  Negative  for dysuria, flank pain, frequency, hematuria and urgency.  Musculoskeletal:  Negative for back pain, joint pain and myalgias.  Skin:  Negative for rash.  Neurological:  Negative for dizziness, tingling, focal weakness, seizures, weakness and headaches.  Endo/Heme/Allergies:  Does not bruise/bleed easily.  Psychiatric/Behavioral:  Negative for depression and suicidal ideas. The patient does not have insomnia.    No Known Allergies  Patient Active Problem List   Diagnosis Date Noted   Necrotizing pneumonia (HCC) 08/24/2021   AKI (acute kidney injury) (HCC) 08/24/2021   COPD (chronic obstructive pulmonary disease) (HCC) 05/14/2018   Nonischemic cardiomyopathy (HCC) 05/11/2018   Reactive airway disease 07/25/2017   Mixed hyperlipidemia 07/25/2017   Hyperglycemia, unspecified 07/25/2017   Complete heart block (HCC) 07/25/2017   Allergic state 07/25/2017   Allergic rhinitis 07/25/2017   Allergic conjunctivitis 07/25/2017   Bilateral leg edema 04/13/2017   Hx of adenomatous colonic polyps 06/24/2016   Cardiac pacemaker 10/05/2015   Benign essential hypertension 10/05/2015   Chronic systolic CHF (congestive heart failure), NYHA class 3 (HCC) 10/21/2014     Past Medical History:  Diagnosis Date   Arthritis    knee   Asthma    Cardiomyopathy (HCC)    CHF (congestive heart failure) (HCC)    COPD (chronic obstructive pulmonary disease) (HCC)    emphysema   Environmental allergies    Hypertension    Presence of permanent cardiac pacemaker      Past Surgical History:  Procedure Laterality Date   CATARACT EXTRACTION W/PHACO Right 12/09/2015   Procedure: CATARACT EXTRACTION PHACO AND INTRAOCULAR LENS PLACEMENT (IOC);  Surgeon: Lockie Mola, MD;  Location: [facility];  Service: Ophthalmology;  Laterality: Right;   CATARACT EXTRACTION W/PHACO  Left 01/13/2016   Procedure: CATARACT EXTRACTION PHACO AND INTRAOCULAR LENS PLACEMENT (IOC);  Surgeon: Lockie Mola, MD;   Location: [facility];  Service: Ophthalmology;  Laterality: Left;   COLONOSCOPY     INSERT / REPLACE / REMOVE PACEMAKER  09/29/08   VIDEO BRONCHOSCOPY Bilateral 08/26/2021   Procedure: VIDEO BRONCHOSCOPY;  Surgeon: Vida Rigger, MD;  Location: [facility];  Service: Thoracic;  Laterality: Bilateral;    Social History   Socioeconomic History   Marital status: Married    Spouse name: Not on file   Number of children: Not on file   Years of education: Not on file   Highest education level: Not on file  Occupational History   Not on file  Tobacco Use   Smoking status: Never   Smokeless tobacco: Never  Vaping Use   Vaping Use: Never used  Substance and Sexual Activity   Alcohol use: No   Drug use: No   Sexual activity: Not on file  Other Topics Concern   Not on file  Social History Narrative   Not on file   Social Determinants of Health   Financial Resource Strain: Not on file  Food Insecurity: Not on file  Transportation Needs: Not on file  Physical Activity: Not on file  Stress: Not on file  Social Connections: Not on file  Intimate Partner Violence: Not on file     Family History  Problem Relation Age of Onset   Asthma Father    Heart attack Sister    Asthma Brother    Rheum arthritis Other      Current Outpatient Medications:    acetaminophen (TYLENOL) 325 MG tablet, Take 500 mg by mouth every 6 (six) hours as needed for mild pain. , Disp: , Rfl:    acetylcysteine (MUCOMYST) 20 % nebulizer solution, Inhale into the lungs., Disp: , Rfl:    albuterol (ACCUNEB) 1.25 MG/3ML nebulizer solution, Inhale into the lungs., Disp: , Rfl:    aspirin 81 MG tablet, Take 81 mg by mouth daily. AM, Disp: , Rfl:    azelastine (OPTIVAR) 0.05 % ophthalmic solution, Apply to eye., Disp: , Rfl:    carvedilol (COREG) 12.5 MG tablet, Take 12.5 mg by mouth 2 (two) times daily with a meal., Disp: , Rfl:    ENTRESTO 24-26 MG, Take 1 tablet by mouth 2 (two) times daily. , Disp: ,  Rfl: 1   fluticasone (FLONASE) 50 MCG/ACT nasal spray, Place 2 sprays into both nostrils daily., Disp: , Rfl:    levocetirizine (XYZAL) 5 MG tablet, Take 1 tablet by mouth every evening., Disp: , Rfl:    meloxicam (MOBIC) 15 MG tablet, Take 1 tablet by mouth daily as needed., Disp: , Rfl:    montelukast (SINGULAIR) 10 MG tablet, Take 10 mg by mouth every evening. , Disp: , Rfl:    tamsulosin (FLOMAX) 0.4 MG CAPS capsule, TAKE 1 CAPSULE(0.4 MG) BY MOUTH EVERY DAY 30 MINUTES AFTER THE SAME MEAL, Disp: , Rfl:    guaiFENesin (MUCINEX) 600 MG 12 hr tablet, Take 600 mg by mouth 2 (two) times daily as needed for to loosen phlegm.  (Patient not taking: Reported on 09/08/2021), Disp: , Rfl:    torsemide (DEMADEX) 20 MG tablet, Take 1 tablet by mouth 2 (two) times daily. (Patient not taking: Reported on 09/08/2021), Disp: , Rfl:    Physical exam:  Vitals:   09/08/21 1317  BP: 123/84  Pulse: 73  Resp: 16  Temp: 98.4 F (36.9 C)  SpO2: 99%  Weight: 201 lb 13.3 oz (91.6 kg)   Physical Exam     CMP Latest Ref Rng & Units 08/25/2021  Glucose 70 - 99 mg/dL 188(C)  BUN 8 - 23 mg/dL 22  Creatinine 1.66 - 0.63 mg/dL 0.16(W)  Sodium 109 - 323 mmol/L 133(L)  Potassium 3.5 - 5.1 mmol/L 3.9  Chloride 98 - 111 mmol/L 102  CO2 22 - 32 mmol/L 24  Calcium 8.9 - 10.3 mg/dL 8.3(L)  Total Protein 6.4 - 8.2 g/dL -  Total Bilirubin 0.2 - 1.0 mg/dL -  Alkaline Phos 50 - 557 Unit/L -  AST 15 - 37 Unit/L -  ALT U/L -   CBC Latest Ref Rng & Units 08/25/2021  WBC 4.0 - 10.5 K/uL 11.1(H)  Hemoglobin 13.0 - 17.0 g/dL 11.0(L)  Hematocrit 39.0 - 52.0 % 32.4(L)  Platelets 150 - 400 K/uL 257    No images are attached to the encounter.  DG Chest 2 View  Result Date: 08/24/2021 CLINICAL DATA:  Chest pain EXAM: CHEST - 2 VIEW COMPARISON:  08/12/2021 FINDINGS: Left chest wall ICD noted with leads in the right atrial appendage, coronary sinus and right ventricle. Heart size appears normal. No pleural effusion  identified. Right upper lobe cavitary process is identified measuring approximately 7.4 x 7.6 cm. Left lung appears clear. Visualized osseous structures appear intact. IMPRESSION: Right upper lobe cavitary process is identified. Findings may reflect cavitating pneumonia versus underlying malignancy. Suggest further evaluation with CT of the chest. Electronically Signed   By: Signa Kell M.D.   On: 08/24/2021 17:42   CT Chest Wo Contrast  Result Date: 08/24/2021 CLINICAL DATA:  Unresolved pneumonia. EXAM: CT CHEST WITHOUT CONTRAST TECHNIQUE: Multidetector CT imaging of the chest was performed following the standard protocol without IV contrast. COMPARISON:  03/11/2020 FINDINGS: Cardiovascular: Normal heart size. There is a left chest wall ICD with leads in the right atrial appendage, coronary sinus and right ventricle. Normal heart size. No pericardial effusion. Mediastinum/Nodes: Normal appearance of the thyroid gland. The trachea appears patent and is midline. Normal appearance of the esophagus. No enlarged supraclavicular or axillary lymph nodes. Prominent right paratracheal lymph nodes are identified measuring up to 0.9 cm, image 66/2. None of these meet CT criteria for adenopathy. The hilar structures are suboptimally evaluated due to lack of IV contrast material. There is abrupt cut off of the right upper lobe bronchus just beyond the takeoff, image 104/5. This is new when compared with remote study dated 02/14/2019 but appears similar to 03/11/20. Here, there is a suggestion of a small central soft tissue nodule measuring 1.1 cm, image 64/2. Lungs/Pleura: No pleural effusion. Extensive postobstructive changes are identified within the right upper lobe. Large cavitary lung mass is noted measuring 8.6 x 5.2 by 6.1 cm, image 54/3. these changes have progressed when compared with 03/11/2020 when there was only mild anterior basal and posterior basal subsegmental atelectasis. The left lung is clear. Upper  Abdomen: No acute abnormality identified within the imaged portions of the upper abdomen. Low-density structure within posterior right hepatic lobe is unchanged from previous exam measuring 2.4 cm. Musculoskeletal: No chest wall mass or suspicious bone lesions identified. IMPRESSION: 1. Large cavitary lung mass within the right upper lobe is identified with surrounding postobstructive changes. Findings have progressed when compared with 03/11/2020 when there was only mild anterior basal and posterior basal subsegmental atelectasis. Differential considerations include inflammatory/infectious etiologies as well as neoplastic processes. 2. Abrupt cut off of the right upper lobe  bronchus just beyond the takeoff. This is new when compared with remote study dated 02/14/2019 but appears similar to 03/11/20. Here, there is a suggestion of a small central soft tissue nodule measuring 1.1 cm. Chronic, indolent neoplastic process can not be excluded. Consider further evaluation with bronchoscopy. 3. Prominent right paratracheal lymph nodes are identified measuring up to 0.9 cm. None of these meet CT criteria for adenopathy. Attention on follow-up imaging is advised. 4. Aortic atherosclerosis. Aortic Atherosclerosis (ICD10-I70.0). Electronically Signed   By: Signa Kell M.D.   On: 08/24/2021 18:18    Assessment and plan- Patient is a 85 y.o. male referred for right upper lobe lung mass  I have reviewed CT chest images independently and I also discussed CT findings with radiology today.  Patient is a never smoker and presented with acute onset symptoms of cough shortness of breath and hemoptysis all of which are improved after presumptive treatment of his pneumonia.  Other infectious work-up including AFB and fungi tell serum were all negative.  Bronchoscopy did not show any evidence of malignancy.  If I get a PET CT scan at this time the infectious component will also show up as a hypermetabolic area and therefore cannot  differentiate between infection and malignancy.  I am therefore inclined to monitor this with a repeat CT scan in 6 weeks and see if there is any improvement in the overall appearance.  Based on that we will decide if he needs a PET scan and a second round of biopsy.  The appearance of the right upper lobe lung mass was mainly cavitary with hardly any solid component to it we will communicate all this with the patient as well   Thank you for this kind referral and the opportunity to participate in the care of this patient   Visit Diagnosis 1. Complete heart block (HCC)   2. Lung mass     Dr. Owens Shark, MD, MPH [facility] [phone] 09/09/2021

## 2021-09-16 LAB — CULTURE, FUNGUS WITHOUT SMEAR

## 2021-09-18 LAB — CULTURE, FUNGUS WITHOUT SMEAR

## 2021-10-04 ENCOUNTER — Other Ambulatory Visit: Payer: Self-pay

## 2021-10-04 ENCOUNTER — Ambulatory Visit
Admission: RE | Admit: 2021-10-04 | Discharge: 2021-10-04 | Disposition: A | Discharge status: Home / Self Care | Payer: Medicare Other | Source: Ambulatory Visit | Attending: Oncology | Admitting: Oncology

## 2021-10-04 DIAGNOSIS — R918 Other nonspecific abnormal finding of lung field: Secondary | ICD-10-CM | POA: Diagnosis present

## 2021-10-07 ENCOUNTER — Other Ambulatory Visit: Payer: Medicare Other

## 2021-10-07 LAB — ACID FAST CULTURE WITH REFLEXED SENSITIVITIES (MYCOBACTERIA): Acid Fast Culture: NEGATIVE

## 2021-10-07 NOTE — Progress Notes (Signed)
Tumor Board Documentation  Patrick Elchanan Bob. was presented by Neldon Labella at our Tumor Board on 10/07/2021, which included representatives from medical oncology, radiation oncology, internal medicine, navigation, pathology, radiology, surgical, genetics, pulmonology, research.  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  Additionally, we reviewed previous medical and familial history, history of present illness, and recent lab results along with all available histopathologic and imaging studies. The tumor board considered available treatment options and made the following recommendations: (S) Active surveillance (Repeat CT in 3 Months)    The following procedures/referrals were also placed: No orders of the defined types were placed in this encounter.   Clinical Trial Status: not discussed   Staging used: Not Applicable  National site-specific guidelines   were discussed with respect to the case.  Tumor board is a meeting of clinicians from various specialty areas who evaluate and discuss patients for whom a multidisciplinary approach is being considered. Final determinations in the plan of care are those of the provider(s). The responsibility for follow up of recommendations given during tumor board is that of the provider.   Today's extended care, comprehensive team conference, Patrick Figueroa was not present for the discussion and was not examined.   Multidisciplinary Tumor Board is a multidisciplinary case peer review process.  Decisions discussed in the Multidisciplinary Tumor Board reflect the opinions of the specialists present at the conference without having examined the patient.  Ultimately, treatment and diagnostic decisions rest with the primary provider(s) and the patient.

## 2021-10-08 ENCOUNTER — Encounter: Payer: Self-pay | Admitting: Oncology

## 2021-10-08 ENCOUNTER — Other Ambulatory Visit: Payer: Self-pay

## 2021-10-08 ENCOUNTER — Inpatient Hospital Stay: Payer: Medicare Other | Attending: Oncology | Admitting: Oncology

## 2021-10-08 VITALS — BP 130/84 | HR 69 | Temp 98.4°F | Resp 18 | Wt 198.7 lb

## 2021-10-08 DIAGNOSIS — I509 Heart failure, unspecified: Secondary | ICD-10-CM | POA: Diagnosis not present

## 2021-10-08 DIAGNOSIS — I110 Hypertensive heart disease with heart failure: Secondary | ICD-10-CM | POA: Diagnosis not present

## 2021-10-08 DIAGNOSIS — R918 Other nonspecific abnormal finding of lung field: Secondary | ICD-10-CM | POA: Diagnosis not present

## 2021-10-08 DIAGNOSIS — Z7982 Long term (current) use of aspirin: Secondary | ICD-10-CM | POA: Diagnosis not present

## 2021-10-12 LAB — ACID FAST CULTURE WITH REFLEXED SENSITIVITIES (MYCOBACTERIA): Acid Fast Culture: NEGATIVE

## 2021-10-22 NOTE — Progress Notes (Signed)
Hematology/Oncology Consult note [facility]  Telephone:([phone] [phone]  Patient Care Team: Marguarite Arbour, MD as PCP - General (Internal Medicine)   Name of the patient: Patrick Figueroa  [id number]  04-29-1936   Date of visit: 10/22/21  Diagnosis-lung mass likely infectious/inflammatory  Chief complaint/ Reason for visit-discuss CT scan results and further management  Heme/Onc history: Patient is a 85 year old male never smoker who was admitted to the hospital in September 2022 with symptoms of progressive fatigue cough and hemoptysis.  He had a CT chest done in the inpatient setting which showed a large cavitary lung mass in the right upper lobe with postobstructive changes.  Overall these changes have progressed as compared to his prior scan in March 2021.  He was treated presumptively for bacterial pneumonia with IV antibiotics and discharged on oral antibiotics.  Dr. Evlyn Clines also performed bronchoscopy and pathology was negative for malignancy.  Pneumocystis PCR negative.  Aspergillus negative.  Acid-fast bacilli negative.  Interval history-patient is currently doing well and denies any significant cough shortness of breath fever.  Appetite and weight have been stable  ECOG PS- 1 Pain scale- 0   Review of systems- Review of Systems  Constitutional:  Positive for malaise/fatigue. Negative for chills, fever and weight loss.  HENT:  Negative for congestion, ear discharge and nosebleeds.   Eyes:  Negative for blurred vision.  Respiratory:  Negative for cough, hemoptysis, sputum production, shortness of breath and wheezing.   Cardiovascular:  Negative for chest pain, palpitations, orthopnea and claudication.  Gastrointestinal:  Negative for abdominal pain, blood in stool, constipation, diarrhea, heartburn, melena, nausea and vomiting.  Genitourinary:  Negative for dysuria, flank pain, frequency, hematuria and urgency.  Musculoskeletal:   Negative for back pain, joint pain and myalgias.  Skin:  Negative for rash.  Neurological:  Negative for dizziness, tingling, focal weakness, seizures, weakness and headaches.  Endo/Heme/Allergies:  Does not bruise/bleed easily.  Psychiatric/Behavioral:  Negative for depression and suicidal ideas. The patient does not have insomnia.      No Known Allergies   Past Medical History:  Diagnosis Date   Arthritis    knee   Asthma    Cardiomyopathy (HCC)    CHF (congestive heart failure) (HCC)    COPD (chronic obstructive pulmonary disease) (HCC)    emphysema   Environmental allergies    Hypertension    Presence of permanent cardiac pacemaker      Past Surgical History:  Procedure Laterality Date   CATARACT EXTRACTION W/PHACO Right 12/09/2015   Procedure: CATARACT EXTRACTION PHACO AND INTRAOCULAR LENS PLACEMENT (IOC);  Surgeon: Lockie Mola, MD;  Location: [facility];  Service: Ophthalmology;  Laterality: Right;   CATARACT EXTRACTION W/PHACO Left 01/13/2016   Procedure: CATARACT EXTRACTION PHACO AND INTRAOCULAR LENS PLACEMENT (IOC);  Surgeon: Lockie Mola, MD;  Location: [facility];  Service: Ophthalmology;  Laterality: Left;   COLONOSCOPY     INSERT / REPLACE / REMOVE PACEMAKER  09/29/08   VIDEO BRONCHOSCOPY Bilateral 08/26/2021   Procedure: VIDEO BRONCHOSCOPY;  Surgeon: Vida Rigger, MD;  Location: [facility];  Service: Thoracic;  Laterality: Bilateral;    Social History   Socioeconomic History   Marital status: Married    Spouse name: Not on file   Number of children: Not on file   Years of education: Not on file   Highest education level: Not on file  Occupational History   Not on file  Tobacco Use   Smoking status: Never  Smokeless tobacco: Never  Vaping Use   Vaping Use: Never used  Substance and Sexual Activity   Alcohol use: No   Drug use: No   Sexual activity: Not on file  Other Topics Concern   Not on file  Social History  Narrative   Not on file   Social Determinants of Health   Financial Resource Strain: Not on file  Food Insecurity: Not on file  Transportation Needs: Not on file  Physical Activity: Not on file  Stress: Not on file  Social Connections: Not on file  Intimate Partner Violence: Not on file    Family History  Problem Relation Age of Onset   Asthma Father    Heart attack Sister    Asthma Brother    Rheum arthritis Other      Current Outpatient Medications:    acetaminophen (TYLENOL) 325 MG tablet, Take 500 mg by mouth every 6 (six) hours as needed for mild pain. , Disp: , Rfl:    acetylcysteine (MUCOMYST) 20 % nebulizer solution, Inhale into the lungs., Disp: , Rfl:    albuterol (ACCUNEB) 1.25 MG/3ML nebulizer solution, Inhale into the lungs., Disp: , Rfl:    aspirin 81 MG tablet, Take 81 mg by mouth daily. AM, Disp: , Rfl:    azelastine (OPTIVAR) 0.05 % ophthalmic solution, Apply to eye., Disp: , Rfl:    carvedilol (COREG) 12.5 MG tablet, Take 12.5 mg by mouth 2 (two) times daily with a meal., Disp: , Rfl:    ENTRESTO 24-26 MG, Take 1 tablet by mouth 2 (two) times daily. , Disp: , Rfl: 1   fluticasone (FLONASE) 50 MCG/ACT nasal spray, Place 2 sprays into both nostrils daily., Disp: , Rfl:    guaiFENesin (MUCINEX) 600 MG 12 hr tablet, Take 600 mg by mouth 2 (two) times daily as needed for to loosen phlegm., Disp: , Rfl:    latanoprost (XALATAN) 0.005 % ophthalmic solution, 1 drop at bedtime., Disp: , Rfl:    levocetirizine (XYZAL) 5 MG tablet, Take 1 tablet by mouth every evening., Disp: , Rfl:    meloxicam (MOBIC) 15 MG tablet, Take 1 tablet by mouth daily as needed., Disp: , Rfl:    montelukast (SINGULAIR) 10 MG tablet, Take 10 mg by mouth every evening. , Disp: , Rfl:    torsemide (DEMADEX) 20 MG tablet, Take 1 tablet by mouth 2 (two) times daily., Disp: , Rfl:    predniSONE (DELTASONE) 20 MG tablet, , Disp: , Rfl:   Physical exam:  Vitals:   10/08/21 1345  BP: 130/84   Pulse: 69  Resp: 18  Temp: 98.4 F (36.9 C)  SpO2: 94%  Weight: 198 lb 11.2 oz (90.1 kg)   Physical Exam Constitutional:      General: He is not in acute distress. Cardiovascular:     Rate and Rhythm: Normal rate and regular rhythm.     Heart sounds: Normal heart sounds.  Pulmonary:     Effort: Pulmonary effort is normal.     Breath sounds: Normal breath sounds.  Abdominal:     General: Bowel sounds are normal.     Palpations: Abdomen is soft.  Skin:    General: Skin is warm and dry.  Neurological:     Mental Status: He is alert and oriented to person, place, and time.     CMP Latest Ref Rng & Units 08/25/2021  Glucose 70 - 99 mg/dL 133(H)  BUN 8 - 23 mg/dL 22  Creatinine 0.61 - 1.24  mg/dL 9.16(X)  Sodium 450 - 388 mmol/L 133(L)  Potassium 3.5 - 5.1 mmol/L 3.9  Chloride 98 - 111 mmol/L 102  CO2 22 - 32 mmol/L 24  Calcium 8.9 - 10.3 mg/dL 8.3(L)  Total Protein 6.4 - 8.2 g/dL -  Total Bilirubin 0.2 - 1.0 mg/dL -  Alkaline Phos 50 - 828 Unit/L -  AST 15 - 37 Unit/L -  ALT U/L -   CBC Latest Ref Rng & Units 08/25/2021  WBC 4.0 - 10.5 K/uL 11.1(H)  Hemoglobin 13.0 - 17.0 g/dL 11.0(L)  Hematocrit 39.0 - 52.0 % 32.4(L)  Platelets 150 - 400 K/uL 257    No images are attached to the encounter.  CT CHEST WO CONTRAST  Result Date: 10/05/2021 CLINICAL DATA:  Follow-up cavitary lung mass EXAM: CT CHEST WITHOUT CONTRAST TECHNIQUE: Multidetector CT imaging of the chest was performed following the standard protocol without IV contrast. COMPARISON:  08/24/2021 FINDINGS: Cardiovascular: Left chest multi lead pacer defibrillator. Scattered aortic atherosclerosis normal heart size. No pericardial effusion. Mediastinum/Nodes: No enlarged mediastinal, hilar, or axillary lymph nodes. Small hiatal hernia. Thyroid gland, trachea, and esophagus demonstrate no significant findings. Lungs/Pleura: Significant interval decrease in size of a thick-walled, masslike cavitary lesion of the anterior  right upper lobe, now measuring approximately 4.8 x 3.4 cm, previously 8.1 x 5.7 cm when measured similarly (series 3, image 51). There is some residual, although improved heterogeneous airspace disease and consolidation, with progression of scarring, fibrosis, and volume loss of the right upper lobe. Redemonstrated proximal occlusion or effacement of the right upper lobe bronchus (series 3, image 57). No pleural effusion or pneumothorax. Upper Abdomen: No acute abnormality. Stable, benign cyst or hemangioma of the posterior liver dome (series 2, image 127). Musculoskeletal: No chest wall mass or suspicious bone lesions identified. IMPRESSION: 1. Significant interval decrease in size of a thick-walled, masslike cavitary lesion of the anterior right upper lobe, now measuring approximately 4.8 x 3.4 cm, previously 8.1 x 5.7 cm when measured similarly. 2. There is some residual, although improved heterogeneous airspace disease and consolidation, with progression of scarring, fibrosis, and volume loss of the right upper lobe. 3. Findings are generally consistent with improving cavitary infection. 4. Redemonstrated proximal occlusion or effacement of the right upper lobe bronchus; as on prior examination, obstructing neoplasm cannot be excluded. Correlate with bronchoscopic findings. Aortic Atherosclerosis (ICD10-I70.0). Electronically Signed   By: Jearld Lesch M.D.   On: 10/05/2021 10:34     Assessment and plan- Patient is a 85 y.o. male referred for further evaluation of lung mass likely infectious/inflammatory  Patient was found to have lung mass in September 2022 and bronchoscopy was negative for malignancy.  Appearance of the mass was Inflammatory and his case was discussed at tumor board at presentation as well and a repeat CT was recommended in about 6 weeks.  I have reviewed CT chest images from 10/04/2021 independently.  We have also discussed his case at tumor board.  There has been an interval decrease  in the size of his right upper lobe lung mass which was previously 8.1 x 5.7 cm and is now down to 4.8 x 3.4 cm.  Findings were overall consistent with improving cavitary infection.  Patient has already completed his antibiotic course and does not seem to have any ongoing symptoms.  My plan is therefore to monitor this conservatively without the need for repeat biopsy.  I will see him back in 3 months with a repeat CT chest without contrast prior  Visit Diagnosis 1. Lung mass      Dr. Randa Evens, MD, MPH [facility] [id number] 10/22/2021 7:54 AM

## 2022-01-10 ENCOUNTER — Ambulatory Visit
Admission: RE | Admit: 2022-01-10 | Discharge: 2022-01-10 | Disposition: A | Discharge status: Home / Self Care | Payer: Medicare Other | Source: Ambulatory Visit | Attending: Oncology | Admitting: Oncology

## 2022-01-10 ENCOUNTER — Other Ambulatory Visit: Payer: Self-pay

## 2022-01-10 DIAGNOSIS — R918 Other nonspecific abnormal finding of lung field: Secondary | ICD-10-CM | POA: Diagnosis present

## 2022-01-12 ENCOUNTER — Encounter: Payer: Self-pay | Admitting: Oncology

## 2022-01-12 ENCOUNTER — Inpatient Hospital Stay: Payer: Medicare Other | Attending: Oncology | Admitting: Oncology

## 2022-01-12 ENCOUNTER — Other Ambulatory Visit: Payer: Self-pay

## 2022-01-12 VITALS — BP 139/74 | HR 61 | Temp 98.9°F | Resp 16 | Ht 69.0 in | Wt 210.0 lb

## 2022-01-12 DIAGNOSIS — R9389 Abnormal findings on diagnostic imaging of other specified body structures: Secondary | ICD-10-CM | POA: Diagnosis not present

## 2022-01-12 DIAGNOSIS — R918 Other nonspecific abnormal finding of lung field: Secondary | ICD-10-CM | POA: Insufficient documentation

## 2022-01-12 DIAGNOSIS — Z79899 Other long term (current) drug therapy: Secondary | ICD-10-CM | POA: Insufficient documentation

## 2022-01-12 NOTE — Progress Notes (Signed)
Hematology/Oncology Consult note [facility]  Telephone:([phone] [phone]  Patient Care Team: Idelle Crouch, MD as PCP - General (Internal Medicine)   Name of the patient: Patrick Figueroa  [id number]  05/28/1936   Date of visit: 01/12/22  Diagnosis-lung mass likely infectious etiology  Chief complaint/ Reason for visit-discuss CT scan results and further management  Heme/Onc history:  Patient is a 86 year old male never smoker who was admitted to the hospital in September 2022 with symptoms of progressive fatigue cough and hemoptysis.  He had a CT chest done in the inpatient setting which showed a large cavitary lung mass in the right upper lobe with postobstructive changes.  Overall these changes have progressed as compared to his prior scan in March 2021.  He was treated presumptively for bacterial pneumonia with IV antibiotics and discharged on oral antibiotics.  Dr. Pandora Leiter also performed bronchoscopy and pathology was negative for malignancy.  Pneumocystis PCR negative.  Aspergillus negative.  Acid-fast bacilli negative.    Interval history-patient reports doing well.  Denies any cough, fever, exertional shortness of breath  ECOG PS- 1 Pain scale- 0   Review of systems- Review of Systems  Constitutional:  Negative for chills, fever, malaise/fatigue and weight loss.  HENT:  Negative for congestion, ear discharge and nosebleeds.   Eyes:  Negative for blurred vision.  Respiratory:  Negative for cough, hemoptysis, sputum production, shortness of breath and wheezing.   Cardiovascular:  Negative for chest pain, palpitations, orthopnea and claudication.  Gastrointestinal:  Negative for abdominal pain, blood in stool, constipation, diarrhea, heartburn, melena, nausea and vomiting.  Genitourinary:  Negative for dysuria, flank pain, frequency, hematuria and urgency.  Musculoskeletal:  Negative for back pain, joint pain and myalgias.  Skin:   Negative for rash.  Neurological:  Negative for dizziness, tingling, focal weakness, seizures, weakness and headaches.  Endo/Heme/Allergies:  Does not bruise/bleed easily.  Psychiatric/Behavioral:  Negative for depression and suicidal ideas. The patient does not have insomnia.     No Known Allergies   Past Medical History:  Diagnosis Date   Arthritis    knee   Asthma    Cardiomyopathy ([place])    CHF (congestive heart failure) (HCC)    COPD (chronic obstructive pulmonary disease) (HCC)    emphysema   Environmental allergies    Hypertension    Presence of permanent cardiac pacemaker      Past Surgical History:  Procedure Laterality Date   CATARACT EXTRACTION W/PHACO Right 12/09/2015   Procedure: CATARACT EXTRACTION PHACO AND INTRAOCULAR LENS PLACEMENT ([place]);  Surgeon: Leandrew Koyanagi, MD;  Location: [place];  Service: Ophthalmology;  Laterality: Right;   CATARACT EXTRACTION W/PHACO Left 01/13/2016   Procedure: CATARACT EXTRACTION PHACO AND INTRAOCULAR LENS PLACEMENT (IOC);  Surgeon: Leandrew Koyanagi, MD;  Location: [place];  Service: Ophthalmology;  Laterality: Left;   COLONOSCOPY     INSERT / REPLACE / REMOVE PACEMAKER  09/29/08   VIDEO BRONCHOSCOPY Bilateral 08/26/2021   Procedure: VIDEO BRONCHOSCOPY;  Surgeon: Ottie Glazier, MD;  Location: [facility];  Service: Thoracic;  Laterality: Bilateral;    Social History   Socioeconomic History   Marital status: Married    Spouse name: Not on file   Number of children: Not on file   Years of education: Not on file   Highest education level: Not on file  Occupational History   Not on file  Tobacco Use   Smoking status: Never   Smokeless tobacco: Never  Vaping  Use   Vaping Use: Never used  Substance and Sexual Activity   Alcohol use: No   Drug use: No   Sexual activity: Not on file  Other Topics Concern   Not on file  Social History Narrative   Not on file   Social Determinants of Health    Financial Resource Strain: Not on file  Food Insecurity: Not on file  Transportation Needs: Not on file  Physical Activity: Not on file  Stress: Not on file  Social Connections: Not on file  Intimate Partner Violence: Not on file    Family History  Problem Relation Age of Onset   Asthma Father    Heart attack Sister    Asthma Brother    Rheum arthritis Other      Current Outpatient Medications:    acetaminophen (TYLENOL) 325 MG tablet, Take 500 mg by mouth every 6 (six) hours as needed for mild pain. , Disp: , Rfl:    acetylcysteine (MUCOMYST) 20 % nebulizer solution, Inhale into the lungs., Disp: , Rfl:    albuterol (ACCUNEB) 1.25 MG/3ML nebulizer solution, Inhale into the lungs., Disp: , Rfl:    aspirin 81 MG tablet, Take 81 mg by mouth daily. AM, Disp: , Rfl:    azelastine (OPTIVAR) 0.05 % ophthalmic solution, Apply to eye., Disp: , Rfl:    carvedilol (COREG) 12.5 MG tablet, Take 12.5 mg by mouth 2 (two) times daily with a meal., Disp: , Rfl:    ENTRESTO 24-26 MG, Take 1 tablet by mouth 2 (two) times daily. , Disp: , Rfl: 1   fluticasone (FLONASE) 50 MCG/ACT nasal spray, Place 2 sprays into both nostrils daily., Disp: , Rfl:    guaiFENesin (MUCINEX) 600 MG 12 hr tablet, Take 600 mg by mouth 2 (two) times daily as needed for to loosen phlegm., Disp: , Rfl:    latanoprost (XALATAN) 0.005 % ophthalmic solution, 1 drop at bedtime., Disp: , Rfl:    levocetirizine (XYZAL) 5 MG tablet, Take 1 tablet by mouth every evening., Disp: , Rfl:    meloxicam (MOBIC) 15 MG tablet, Take 1 tablet by mouth daily as needed., Disp: , Rfl:    montelukast (SINGULAIR) 10 MG tablet, Take 10 mg by mouth every evening. , Disp: , Rfl:    torsemide (DEMADEX) 20 MG tablet, Take 1 tablet by mouth 2 (two) times daily., Disp: , Rfl:    predniSONE (DELTASONE) 20 MG tablet, , Disp: , Rfl:   Physical exam:  Vitals:   01/12/22 1405  BP: 139/74  Pulse: 61  Resp: 16  Temp: 98.9 F (37.2 C)  TempSrc:  Tympanic  SpO2: 100%  Weight: 210 lb (95.3 kg)  Height: 5\' 9"  (1.753 m)   Physical Exam Constitutional:      General: He is not in acute distress. Cardiovascular:     Rate and Rhythm: Normal rate and regular rhythm.     Heart sounds: Normal heart sounds.  Pulmonary:     Effort: Pulmonary effort is normal.     Breath sounds: Normal breath sounds.  Skin:    General: Skin is warm and dry.  Neurological:     Mental Status: He is alert and oriented to person, place, and time.     CMP Latest Ref Rng & Units 08/25/2021  Glucose 70 - 99 mg/dL 133(H)  BUN 8 - 23 mg/dL 22  Creatinine 0.61 - 1.24 mg/dL 1.31(H)  Sodium 135 - 145 mmol/L 133(L)  Potassium 3.5 - 5.1 mmol/L 3.9  Chloride 98 - 111 mmol/L 102  CO2 22 - 32 mmol/L 24  Calcium 8.9 - 10.3 mg/dL 8.3(L)  Total Protein 6.4 - 8.2 g/dL -  Total Bilirubin 0.2 - 1.0 mg/dL -  Alkaline Phos 50 - 136 Unit/L -  AST 15 - 37 Unit/L -  ALT U/L -   CBC Latest Ref Rng & Units 08/25/2021  WBC 4.0 - 10.5 K/uL 11.1(H)  Hemoglobin 13.0 - 17.0 g/dL 11.0(L)  Hematocrit 39.0 - 52.0 % 32.4(L)  Platelets 150 - 400 K/uL 257    No images are attached to the encounter.  CT Chest Wo Contrast  Result Date: 01/11/2022 CLINICAL DATA:  Cavitary lung mass.  Follow-up. EXAM: CT CHEST WITHOUT CONTRAST TECHNIQUE: Multidetector CT imaging of the chest was performed following the standard protocol without IV contrast. RADIATION DOSE REDUCTION: This exam was performed according to the departmental dose-optimization program which includes automated exposure control, adjustment of the mA and/or kV according to patient size and/or use of iterative reconstruction technique. COMPARISON:  10/04/2021 FINDINGS: Cardiovascular: The heart size is normal. No substantial pericardial effusion. Mild atherosclerotic calcification is noted in the wall of the thoracic aorta. Mediastinum/Nodes: No mediastinal lymphadenopathy. No evidence for gross hilar lymphadenopathy although  assessment is limited by the lack of intravenous contrast on the current study. The esophagus has normal imaging features. There is no axillary lymphadenopathy. Lungs/Pleura: Continued further decrease in the cavitary lesion noted previously in the anterior right upper lobe. There is no persistent cavitary component on today's study with a bandlike area of consolidative volume loss in the region today. The ill-defined patchy and nodular airspace disease seen peripherally towards the right lung apex has nearly resolved. No new suspicious pulmonary nodule or mass. No suspicious nodule or mass in the left lung. Upper Abdomen: Tiny scattered hypodensities in the liver are stable, likely cyst. Musculoskeletal: No worrisome lytic or sclerotic osseous abnormality. IMPRESSION: Continued further decrease in the cavitary lesion noted previously in the anterior right upper lobe. There is no persistent cavitary component on today's study with a residual bandlike area of consolidative volume loss in the region today. The ill-defined patchy and nodular airspace disease seen peripherally towards the right lung apex has nearly resolved. As on prior studies, the right upper lobe bronchus remains effaced. Aortic Atherosclerosis (ICD10-I70.0). Electronically Signed   By: Misty Stanley M.D.   On: 01/11/2022 11:23     Assessment and plan- Patient is a 86 y.o. male referred for evaluation of lung mass likely infectious/inflammatory.  He is here to discuss CT scan results and further management I have reviewed CT chest images independently and discussed findings with the patient and his wife.There continues to be decrease in the size of the cavitary lesion in the right upper lobe with no significant cavitary component seen.  The surrounding nodular airspace disease in the lung apex is also resolved.  These findings are all consistent with resolving inflammatory/infectious component.  Patient does not require any further CT scans or  biopsies moving forward from oncology standpoint.  He does not require any oncology follow-up at this time.  He does have a follow-up coming up with pulmonary next month as well.  Decision for any further CT scans will be deferred to pulmonary at this time   Visit Diagnosis 1. Abnormal CT of the chest      Dr. Randa Evens, MD, MPH [facility] [id number] 01/12/2022 3:59 PM

## 2022-11-18 LAB — HISTOPLASMA GAL'MANNAN AG SER: Histoplasma Gal'mannan Ag Ser: <0.5 (ref <0.5)

## 2023-02-10 ENCOUNTER — Other Ambulatory Visit: Payer: Self-pay | Admitting: Orthopedic Surgery

## 2023-02-17 ENCOUNTER — Encounter
Admission: RE | Admit: 2023-02-17 | Discharge: 2023-02-17 | Disposition: A | Discharge status: Home / Self Care | Payer: Medicare Other | Source: Ambulatory Visit | Attending: Orthopedic Surgery | Admitting: Orthopedic Surgery

## 2023-02-17 VITALS — BP 128/81 | HR 72 | Temp 97.9°F | Resp 14 | Ht 66.0 in | Wt 225.0 lb

## 2023-02-17 DIAGNOSIS — Z01818 Encounter for other preprocedural examination: Secondary | ICD-10-CM | POA: Diagnosis present

## 2023-02-17 DIAGNOSIS — Z01812 Encounter for preprocedural laboratory examination: Secondary | ICD-10-CM

## 2023-02-17 HISTORY — DX: Bronchiectasis with (acute) exacerbation: J47.1

## 2023-02-17 HISTORY — DX: Elevated prostate specific antigen (PSA): R97.20

## 2023-02-17 HISTORY — DX: Chronic systolic (congestive) heart failure: I50.22

## 2023-02-17 HISTORY — DX: Atrioventricular block, complete: I44.2

## 2023-02-17 HISTORY — DX: Essential (primary) hypertension: I10

## 2023-02-17 HISTORY — DX: Anemia, unspecified: D64.9

## 2023-02-17 HISTORY — DX: Gangrene and necrosis of lung: J85.0

## 2023-02-17 HISTORY — DX: Unspecified osteoarthritis, unspecified site: M19.90

## 2023-02-17 HISTORY — DX: Personal history of adenomatous and serrated colon polyps: Z86.0101

## 2023-02-17 HISTORY — DX: Mixed hyperlipidemia: E78.2

## 2023-02-17 HISTORY — DX: Personal history of colonic polyps: Z86.010

## 2023-02-17 LAB — CBC WITH DIFFERENTIAL/PLATELET
Abs Immature Granulocytes: 0.02 10*3/uL (ref 0.00–0.07)
Basophils Absolute: 0 10*3/uL (ref 0.0–0.1)
Basophils Relative: 1 %
Eosinophils Absolute: 0.3 10*3/uL (ref 0.0–0.5)
Eosinophils Relative: 4 %
HCT: 41.7 % (ref 39.0–52.0)
Hemoglobin: 13.2 g/dL (ref 13.0–17.0)
Immature Granulocytes: 0 %
Lymphocytes Relative: 22 %
Lymphs Abs: 1.3 10*3/uL (ref 0.7–4.0)
MCH: 27.8 pg (ref 26.0–34.0)
MCHC: 31.7 g/dL (ref 30.0–36.0)
MCV: 87.8 fL (ref 80.0–100.0)
Monocytes Absolute: 0.8 10*3/uL (ref 0.1–1.0)
Monocytes Relative: 13 %
Neutro Abs: 3.4 10*3/uL (ref 1.7–7.7)
Neutrophils Relative %: 60 %
Platelets: 198 10*3/uL (ref 150–400)
RBC: 4.75 MIL/uL (ref 4.22–5.81)
RDW: 14.2 % (ref 11.5–15.5)
WBC: 5.7 10*3/uL (ref 4.0–10.5)
nRBC: 0 % (ref 0.0–0.2)

## 2023-02-17 LAB — URINALYSIS, ROUTINE W REFLEX MICROSCOPIC
Bilirubin Urine: NEGATIVE
Glucose, UA: NEGATIVE mg/dL
Hgb urine dipstick: NEGATIVE
Ketones, ur: NEGATIVE mg/dL
Leukocytes,Ua: NEGATIVE
Nitrite: NEGATIVE
Protein, ur: NEGATIVE mg/dL
Specific Gravity, Urine: 1.017 (ref 1.005–1.030)
pH: 5 (ref 5.0–8.0)

## 2023-02-17 LAB — COMPREHENSIVE METABOLIC PANEL
ALT: 13 U/L (ref 0–44)
AST: 16 U/L (ref 15–41)
Albumin: 3.9 g/dL (ref 3.5–5.0)
Alkaline Phosphatase: 62 U/L (ref 38–126)
Anion gap: 7 (ref 5–15)
BUN: 21 mg/dL (ref 8–23)
CO2: 27 mmol/L (ref 22–32)
Calcium: 9 mg/dL (ref 8.9–10.3)
Chloride: 108 mmol/L (ref 98–111)
Creatinine, Ser: 1.45 mg/dL — ABNORMAL HIGH (ref 0.61–1.24)
GFR, Estimated: 48 mL/min — ABNORMAL LOW (ref >60)
Glucose, Bld: 86 mg/dL (ref 70–99)
Potassium: 3.9 mmol/L (ref 3.5–5.1)
Sodium: 142 mmol/L (ref 135–145)
Total Bilirubin: 1.1 mg/dL (ref 0.3–1.2)
Total Protein: 6.9 g/dL (ref 6.5–8.1)

## 2023-02-17 LAB — TYPE AND SCREEN
ABO/RH(D): O POS
Antibody Screen: NEGATIVE

## 2023-02-17 LAB — SURGICAL PCR SCREEN
MRSA, PCR: NEGATIVE
Staphylococcus aureus: NEGATIVE

## 2023-02-17 NOTE — Patient Instructions (Addendum)
Your procedure is scheduled on: Thursday, March 14 Report to the Registration Desk on the 1st floor of the [facility]. To find out your arrival time, please call [phone] between 1PM - 3PM on: Wednesday, March 13 If your arrival time is 6:00 am, do not arrive before that time as the [facility] entrance doors do not open until 6:00 am.  REMEMBER: Instructions that are not followed completely may result in serious medical risk, up to and including death; or upon the discretion of your surgeon and anesthesiologist your surgery may need to be rescheduled.  Do not eat food after midnight the night before surgery.  No gum chewing or hard candies.  You may however, drink CLEAR liquids up to 2 hours before you are scheduled to arrive for your surgery. Do not drink anything within 2 hours of your scheduled arrival time.  Clear liquids include: - water  - apple juice without pulp - gatorade (not RED colors) - black coffee or tea (Do NOT add milk or creamers to the coffee or tea) Do NOT drink anything that is not on this list.  In addition, your doctor has ordered for you to drink the provided:  Ensure Pre-Surgery Clear Carbohydrate Drink  Drinking this carbohydrate drink up to two hours before surgery helps to reduce insulin resistance and improve patient outcomes. Please complete drinking 2 hours before scheduled arrival time.  One week prior to surgery: starting March 7 Stop Anti-inflammatories (NSAIDS) such as Advil, Aleve, Ibuprofen, Motrin, Naproxen, Naprosyn and Aspirin based products such as Excedrin, Goody's Powder, BC Powder. Stop ANY OVER THE COUNTER supplements until after surgery. You may however, continue to take Tylenol if needed for pain up until the day of surgery.  Continue taking all prescribed medications with the exception of the following:  Per cardiology, Leroy Libman, PA: stop aspirin 3 day before surgery. Last day to take aspirin is March 10. Resume on March  15.  TAKE ONLY THESE MEDICATIONS THE MORNING OF SURGERY WITH A SIP OF WATER:  albuterol nebulizer Carvedilol Flonase nasal spray  No Alcohol for 24 hours before or after surgery.  No Smoking including e-cigarettes for 24 hours before surgery.  No chewable tobacco products for at least 6 hours before surgery.  No nicotine patches on the day of surgery.  Do not use any "recreational" drugs for at least a week (preferably 2 weeks) before your surgery.  Please be advised that the combination of cocaine and anesthesia may have negative outcomes, up to and including death. If you test positive for cocaine, your surgery will be cancelled.  On the morning of surgery brush your teeth with toothpaste and water, you may rinse your mouth with mouthwash if you wish. Do not swallow any toothpaste or mouthwash.  Use CHG Soap as directed on instruction sheet.  Do not wear jewelry, make-up, hairpins, clips or nail polish.  Do not wear lotions, powders, or perfumes.   Do not shave body hair from the neck down 48 hours before surgery.  Contact lenses, hearing aids and dentures may not be worn into surgery.  Do not bring valuables to the hospital. [facility] is not responsible for any missing/lost belongings or valuables.   Notify your doctor if there is any change in your medical condition (cold, fever, infection).  Wear comfortable clothing (specific to your surgery type) to the hospital.  After surgery, you can help prevent lung complications by doing breathing exercises.  Take deep breaths and cough every 1-2  hours. Your doctor may order a device called an Incentive Spirometer to help you take deep breaths.  If you are being admitted to the hospital overnight, leave your suitcase in the car. After surgery it may be brought to your room.  In case of increased patient census, it may be necessary for you, the patient, to continue your postoperative care in the Same Day Surgery  department.  If you are being discharged the day of surgery, you will not be allowed to drive home. You will need a responsible individual to drive you home and stay with you for 24 hours after surgery.   If you are taking public transportation, you will need to have a responsible individual with you.  Please call the [facility] Dept. at [phone] if you have any questions about these instructions.  Surgery Visitation Policy:  Patients undergoing a surgery or procedure may have two family members or support persons with them as long as the person is not COVID-19 positive or experiencing its symptoms.   Inpatient Visitation:    Visiting hours are 7 a.m. to 8 p.m. Up to four visitors are allowed at one time in a patient room. The visitors may rotate out with other people during the day. One designated support person (adult) may remain overnight.  Due to an increase in RSV and influenza rates and associated hospitalizations, children ages 22 and under will not be able to visit patients in [facility]. Masks continue to be strongly recommended.     Preparing for Surgery with CHLORHEXIDINE GLUCONATE (CHG) Soap  Chlorhexidine Gluconate (CHG) Soap  o An antiseptic cleaner that kills germs and bonds with the skin to continue killing germs even after washing  o Used for showering the night before surgery and morning of surgery  Before surgery, you can play an important role by reducing the number of germs on your skin.  CHG (Chlorhexidine gluconate) soap is an antiseptic cleanser which kills germs and bonds with the skin to continue killing germs even after washing.  Please do not use if you have an allergy to CHG or antibacterial soaps. If your skin becomes reddened/irritated stop using the CHG.  1. Shower the NIGHT BEFORE SURGERY and the MORNING OF SURGERY with CHG soap.  2. If you choose to wash your hair, wash your hair first as usual with your normal  shampoo.  3. After shampooing, rinse your hair and body thoroughly to remove the shampoo.  4. Use CHG as you would any other liquid soap. You can apply CHG directly to the skin and wash gently with a scrungie or a clean washcloth.  5. Apply the CHG soap to your body only from the neck down. Do not use on open wounds or open sores. Avoid contact with your eyes, ears, mouth, and genitals (private parts). Wash face and genitals (private parts) with your normal soap.  6. Wash thoroughly, paying special attention to the area where your surgery will be performed.  7. Thoroughly rinse your body with warm water.  8. Do not shower/wash with your normal soap after using and rinsing off the CHG soap.  9. Pat yourself dry with a clean towel.  10. Wear clean pajamas to bed the night before surgery.  12. Place clean sheets on your bed the night of your first shower and do not sleep with pets.  13. Shower again with the CHG soap on the day of surgery prior to arriving at the hospital.  14. Do  not apply any deodorants/lotions/powders.  15. Please wear clean clothes to the hospital.  Preoperative Educational Videos for Total Hip, Knee and Shoulder Replacements  To better prepare for surgery, please view our videos that explain the physical activity and discharge planning required to have the best surgical recovery at [facility].  [email or web address]  Questions? Call [phone] or email [email or web address]

## 2023-02-27 ENCOUNTER — Encounter: Payer: Self-pay | Admitting: Orthopedic Surgery

## 2023-02-27 NOTE — Progress Notes (Signed)
Perioperative / Anesthesia Services  Pre-Admission Testing Clinical Review / Preoperative Anesthesia Consult  Date: 02/28/23  Patient Demographics:  Name: Patrick Figueroa. DOB:   Dec 12, 1936 MRN:   [id number]  Planned Surgical Procedure(s):    Case: [id number] Date/Time: 03/02/23 0715   Procedure: TOTAL KNEE ARTHROPLASTY (Right: Knee)   Anesthesia type: Choice   Pre-op diagnosis: Osteoarthritis of right knee, unspecified osteoarthritis type M17.11   Location: [facility] OR ROOM 01 / [facility]   Surgeons: Steffanie Rainwater, MD     NOTE: Available PAT nursing documentation and vital signs have been reviewed. Clinical nursing staff has updated patient's PMH/PSHx, current medication list, and drug allergies/intolerances to ensure comprehensive history available to assist in medical decision making as it pertains to the aforementioned surgical procedure and anticipated anesthetic course. Extensive review of available clinical information personally performed. [place] PMH and PSHx updated with any diagnoses/procedures that  may have been inadvertently omitted during his intake with the pre-admission testing department's nursing staff.  Clinical Discussion:  Patrick Gunar Trimmer. is a 87 y.o. male who is submitted for pre-surgical anesthesia review and clearance prior to him undergoing the above procedure. Patient has never been a smoker. Pertinent PMH includes: CHB (s/p PPM), NICM (s/p AICD), CHF, aortic atherosclerosis, HTN, HLD, COPD, asthma, bronchiectasis, necrotizing pneumonia, anemia, OA.  Patient is followed by cardiology [name], MD). He was last seen in the cardiology clinic on 11/29/2022; notes reviewed. At the time of his clinic visit, patient doing well overall from a cardiovascular perspective. Patient denied any chest pain, shortness of breath, PND, orthopnea, palpitations, significant peripheral edema, weakness, fatigue, vertiginous symptoms, or presyncope/syncope. Patient  with a past medical history significant for cardiovascular diagnoses. Documented physical exam was grossly benign, providing no evidence of acute exacerbation and/or decompensation of the patient's known cardiovascular conditions.  Patient developed complete heart block/sick sinus syndrome in 2009.  Implanted pacemaker device was placed.  Patient upgraded to CRT-D device on 06/25/2018.  Medtronic Claria MRI Quad BiV device was placed.  Device regularly interrogated by electrophysiology team, with last check occurred on 12/06/2022 showing normal device functioning.  Patient diagnosed with a dilated cardiomyopathy (NICM) with resulting CHF.  TTE on 05/02/2016 revealed a severely reduced left ventricular systolic function with an EF of 25%.  Cardiac function has been monitored with the addition of CRT-D therapy and pharmacological management.  Last TTE was performed on 12/07/2022 showing a normal left ventricular systolic function with an EF of >55%. Left ventricular diastolic Doppler parameters consistent with abnormal relaxation (G1DD). There was trivial mitral and mild tricuspid valve regurgitation. All transvalvular gradients were noted to be normal providing no evidence suggestive of valvular stenosis.  Heart failure and blood pressure well-controlled on currently prescribed regimen of beta-blocker (carvedilol), ARB/ANRi (Entresto) and diuretic (torsemide) therapies; blood pressure documented at 126/84 mmHg.  Patient is not currently taking any type of lipid-lowering therapies for his HLD diagnosis and ASCVD prevention.  He is not diabetic. Patient does not have an OSAH diagnosis.  Patient making efforts to maintain an active lifestyle.  Patient walks for exercise on a regular basis. Functional capacity, as defined by DASI, is documented as being >/= 4 METS.  No changes were made to his medication regimen.  Patient follow-up with outpatient cardiology in 9 months or sooner if needed.  Patrick Aason Benoit. is  scheduled for an elective RIGHT TOTAL KNEE ARTHROPLASTY on 03/01/2022 with Dr. Steffanie Rainwater, MD.  Given patient's past medical history significant for cardiovascular diagnoses,  presurgical cardiac clearance was sought by the PAT team. Additionally, surgeon requested clearance from patient's PCP. Requested clearances received as follows.   Per internal medicine Doy Hutching, MD), "this patient is optimized for surgery from a medicine perspective. I recommend cardiac clearance was well. Once cleared by cardiology, patient may proceed with the planned procedural course with an overall MODERATE risk of significant perioperative complications".  Per cardiology Albertine Patricia, PA-C), "this patient is optimized for surgery and may proceed with the planned procedural course with a LOW risk of significant perioperative cardiovascular complications".    In review of his medication reconciliation, it is noted that patient is currently on prescribed daily antiplatelet therapy. He has been instructed on recommendations for holding his daily low-dose ASA for 3 days prior to his procedure with plans to restart as soon as postoperative bleeding risk felt to be minimized by his attending surgeon. The patient has been instructed that his last dose of his ASA should be on 02/26/2023.  Patient denies previous perioperative complications with anesthesia in the past. In review of the available records, it is noted that patient underwent a general anesthetic course here at [facility] ([facility]) in 08/2021 without documented complications.      02/17/2023   10:04 AM 01/12/2022    2:05 PM 10/08/2021    1:45 PM  Vitals with BMI  Height '5\' 6"'$  '5\' 9"'$    Weight 225 lbs 210 lbs 198 lbs 11 oz  BMI [id number]   Systolic 0000000 [id number]  Diastolic 81 74 84  Pulse 72 61 69    Providers/Specialists:   NOTE: Primary physician provider listed below. Patient may have been seen by APP or partner within same  practice.   PROVIDER ROLE / SPECIALTY LAST Danice Goltz, MD Orthopedics (Surgeon) 02/22/2023  Idelle Crouch, MD Primary Care Provider 01/23/2023  Serafina Royals, MD Cardiology 11/29/2022  Ottie Glazier, MD Pulmonary Medicine 11/24/2022   Allergies:  Patient has no known allergies.  Current Home Medications:   No current facility-administered medications for this encounter.    acetaminophen (TYLENOL) 650 MG CR tablet   acetylcysteine (MUCOMYST) 20 % nebulizer solution   albuterol (ACCUNEB) 1.25 MG/3ML nebulizer solution   aspirin 81 MG tablet   azelastine (OPTIVAR) 0.05 % ophthalmic solution   azithromycin (ZITHROMAX) 250 MG tablet   carvedilol (COREG) 12.5 MG tablet   cetirizine (ZYRTEC) 10 MG tablet   ENTRESTO 24-26 MG   fluticasone (FLONASE) 50 MCG/ACT nasal spray   guaiFENesin (MUCINEX) 600 MG 12 hr tablet   latanoprost (XALATAN) 0.005 % ophthalmic solution   meloxicam (MOBIC) 15 MG tablet   montelukast (SINGULAIR) 10 MG tablet   senna-docusate (SENOKOT-S) 8.6-50 MG tablet   Sennosides (EX-LAX MAXIMUM STRENGTH) 25 MG TABS   torsemide (DEMADEX) 20 MG tablet   History:   Past Medical History:  Diagnosis Date   AICD (automatic cardioverter/defibrillator) present 06/25/2018   a.) Medtronic Claria MRI Quad BIV ICD with MRI conditional leads   Anemia    Aortic atherosclerosis (HCC)    Arthritis    Asthma    Benign essential hypertension    Bronchiectasis with (acute) exacerbation (HCC)    Mounier-Kuhn   CHF (congestive heart failure), NYHA class III, chronic, systolic (HCC)    a.) TTE 05/02/2016: EF 25%, mod LVE, sev glob HK, LAE, triv PR, mild MR/TR; b.) TTE 04/27/2018: EF 20%, mod LVE, sev glob HK, BAE, RVE, triv AR/PR, mild MR/TR; c.) TTE 01/10/2019: EF 40%,  mild glob HK, triv AR/PR, mild MR/TR; d.) TTE 04/28/2021: EF 50% triv AR, mild MR/TR; e.) TTE 12/07/2022: EF >55%, triv MR, mild TR, G1DD   Complete heart block ([place])    a.) s/p PPM placement 2009  for SSS/CHB; b.) upgraded to CRT-D 06/25/2018 (Medtronic Claria MRI Quad BIV ICD with MRI conditional leads)   COPD (chronic obstructive pulmonary disease) (HCC)    Elevated PSA    Environmental allergies    History of bilateral cataract extraction    History of cardiac pacemaker 2009   a.) placed in 2009; removed and upgraded to CRT-D device 06/25/2018   Hx of adenomatous colonic polyps    Mixed hyperlipidemia    Necrotizing pneumonia ([place])    NICM (nonischemic cardiomyopathy) ([place])    a.) TTE 05/02/2016: EF 25%; b.) TTE 04/27/2018: EF 20%; c.) CRT-D placed 06/25/2018: d.) TTE 01/10/2019: EF 40%; e.) TTE 04/28/2021: EF 50%; f.) TTE 12/07/2022: EF >55%   Osteoarthritis    Past Surgical History:  Procedure Laterality Date   CARDIAC DEFIBRILLATOR PLACEMENT N/A 06/25/2018   Procedure: INSERTION OR REPLACEMENT OF PERMANENT IMPLANTABLE DEFIBRILLATOR SYSTEM, WITH TRANSVENOUS LEAD(S), SINGLE OR DUAL CHAMBER; Location: [facility]   CATARACT EXTRACTION W/PHACO Right 12/09/2015   Procedure: CATARACT EXTRACTION PHACO AND INTRAOCULAR LENS PLACEMENT ([place]);  Surgeon: Leandrew Koyanagi, MD;  Location: [facility];  Service: Ophthalmology;  Laterality: Right;   CATARACT EXTRACTION W/PHACO Left 01/13/2016   Procedure: CATARACT EXTRACTION PHACO AND INTRAOCULAR LENS PLACEMENT ([facility]);  Surgeon: Leandrew Koyanagi, MD;  Location: [facility];  Service: Ophthalmology;  Laterality: Left;   COLONOSCOPY N/A 2007   COLONOSCOPY N/A 2012   MEDIASTINOTOMY N/A 06/25/2018   Procedure: MEDIASTINOTOMY WITH EXPLORATION, DRAINAGE, REMOVAL OF FOREIGN BODY, OR BIOPSY; TRANSTHORACIC APPROACH, INCLUDING EITHER TRANSTHORACIC OR MEDIAN STERNOTOMY, for open lead extraction; Location: [facility]   PACEMAKER INSERTION  09/29/2008   PACEMAKER REMOVAL N/A 06/25/2018   Procedure: REMOVAL OF SINGLE OR DUAL CHAMBER IMPLANTABLE DEFIBRILLATOR ELECTRODE(S); BY TRANSVENOUS EXTRACTION; Location: [facility]   VIDEO BRONCHOSCOPY Bilateral  08/26/2021   Procedure: VIDEO BRONCHOSCOPY;  Surgeon: Ottie Glazier, MD;  Location: [facility];  Service: Thoracic;  Laterality: Bilateral;   Family History  Problem Relation Age of Onset   Asthma Father    Heart attack Sister    Asthma Brother    Rheum arthritis Other    Social History   Tobacco Use   Smoking status: Never   Smokeless tobacco: Never  Vaping Use   Vaping Use: Never used  Substance Use Topics   Alcohol use: No   Drug use: No    Pertinent Clinical Results:  LABS:   No visits with results within 3 Day(s) from this visit.  Latest known visit with results is:  Hospital Outpatient Visit on 02/17/2023  Component Date Value Ref Range Status   WBC 02/17/2023 5.7  4.0 - 10.5 K/uL Final   RBC 02/17/2023 4.75  4.22 - 5.81 MIL/uL Final   Hemoglobin 02/17/2023 13.2  13.0 - 17.0 g/dL Final   HCT 02/17/2023 41.7  39.0 - 52.0 % Final   MCV 02/17/2023 87.8  80.0 - 100.0 fL Final   MCH 02/17/2023 27.8  26.0 - 34.0 pg Final   MCHC 02/17/2023 31.7  30.0 - 36.0 g/dL Final   RDW 02/17/2023 14.2  11.5 - 15.5 % Final   Platelets 02/17/2023 198  150 - 400 K/uL Final   nRBC 02/17/2023 0.0  0.0 - 0.2 % Final   Neutrophils Relative % 02/17/2023 60  %  Final   Neutro Abs 02/17/2023 3.4  1.7 - 7.7 K/uL Final   Lymphocytes Relative 02/17/2023 22  % Final   Lymphs Abs 02/17/2023 1.3  0.7 - 4.0 K/uL Final   Monocytes Relative 02/17/2023 13  % Final   Monocytes Absolute 02/17/2023 0.8  0.1 - 1.0 K/uL Final   Eosinophils Relative 02/17/2023 4  % Final   Eosinophils Absolute 02/17/2023 0.3  0.0 - 0.5 K/uL Final   Basophils Relative 02/17/2023 1  % Final   Basophils Absolute 02/17/2023 0.0  0.0 - 0.1 K/uL Final   Immature Granulocytes 02/17/2023 0  % Final   Abs Immature Granulocytes 02/17/2023 0.02  0.00 - 0.07 K/uL Final   Performed at [facility], [place], [place] [address]   Sodium 02/17/2023 142  135 - 145 mmol/L Final   Potassium 02/17/2023 3.9  3.5 -  5.1 mmol/L Final   Chloride 02/17/2023 108  98 - 111 mmol/L Final   CO2 02/17/2023 27  22 - 32 mmol/L Final   Glucose, Bld 02/17/2023 86  70 - 99 mg/dL Final   Glucose reference range applies only to samples taken after fasting for at least 8 hours.   BUN 02/17/2023 21  8 - 23 mg/dL Final   Creatinine, Ser 02/17/2023 1.45 (H)  0.61 - 1.24 mg/dL Final   Calcium 02/17/2023 9.0  8.9 - 10.3 mg/dL Final   Total Protein 02/17/2023 6.9  6.5 - 8.1 g/dL Final   Albumin 02/17/2023 3.9  3.5 - 5.0 g/dL Final   AST 02/17/2023 16  15 - 41 U/L Final   ALT 02/17/2023 13  0 - 44 U/L Final   Alkaline Phosphatase 02/17/2023 62  38 - 126 U/L Final   Total Bilirubin 02/17/2023 1.1  0.3 - 1.2 mg/dL Final   GFR, Estimated 02/17/2023 48 (L)  >60 mL/min Final   Comment: (NOTE) Calculated using the CKD-EPI Creatinine Equation (2021)    Anion gap 02/17/2023 7  5 - 15 Final   Performed at [facility], [place], [place] [address]   Color, Urine 02/17/2023 YELLOW (A)  YELLOW Final   APPearance 02/17/2023 CLEAR (A)  CLEAR Final   Specific Gravity, Urine 02/17/2023 1.017  1.005 - 1.030 Final   pH 02/17/2023 5.0  5.0 - 8.0 Final   Glucose, UA 02/17/2023 NEGATIVE  NEGATIVE mg/dL Final   Hgb urine dipstick 02/17/2023 NEGATIVE  NEGATIVE Final   Bilirubin Urine 02/17/2023 NEGATIVE  NEGATIVE Final   Ketones, ur 02/17/2023 NEGATIVE  NEGATIVE mg/dL Final   Protein, ur 02/17/2023 NEGATIVE  NEGATIVE mg/dL Final   Nitrite 02/17/2023 NEGATIVE  NEGATIVE Final   Leukocytes,Ua 02/17/2023 NEGATIVE  NEGATIVE Final   Performed at [facility], [address] [place], [place] [address]   ABO/RH(D) 02/17/2023 O POS   Final   Antibody Screen 02/17/2023 NEG   Final   Sample Expiration 02/17/2023 03/03/2023,2359   Final   Extend sample reason 02/17/2023    Final                   Value:NO TRANSFUSIONS OR PREGNANCY IN THE PAST 3 MONTHS Performed at [facility], [address]  [place],  [address]    MRSA, PCR 02/17/2023 NEGATIVE  NEGATIVE Final   Staphylococcus aureus 02/17/2023 NEGATIVE  NEGATIVE Final   Comment: (NOTE) The Xpert SA Assay (FDA approved for NASAL specimens in patients 59 years of age and older), is one component of  a comprehensive surveillance program. It is not intended to diagnose infection nor to guide or monitor treatment. Performed at [facility], [address] [place], [place] [address]     ECG: Date: 02/17/2023 Time ECG obtained: 1110 AM Rate: 61 bpm Rhythm:  Ventricular paced rhythm Axis (leads I and aVF): Normal Intervals: QRS 108 ms. QTc 475 ms. ST segment and T wave changes: No evidence of acute ST segment elevation or depression Comparison: Similar to previous tracing obtained on 08/24/2021   IMAGING / PROCEDURES: TRANSTHORACIC ECHOCARDIOGRAM performed on 12/07/2022 Normal left ventricular systolic function with an EF of >55% Mild LVH No regional wall motion abnormalities Left ventricular diastolic Doppler parameters consistent with abnormal relaxation (G1DD). Normal right ventricular systolic function Trivial MR Mild TR Normal transvalvular gradients; no valvular stenosis No pericardial effusion  DIAGNOSTIC RADIOGRAPHS OF RIGHT KNEE 4+ VIEWS performed on 11/22/2022 Patient has complete loss of joint space in the lateral compartment with slight valgus  deformity.   Medial compartment with sclerotic changes along the medial tibial plateau.   Advanced patellofemoral osteoarthritis with spurring.   Patella tracks on the trochlear groove with advanced patellofemoral arthritic changes present.   Mid PA flexion view shows worsening valgus deformity with complete loss of joint space and lateral compartment   CT CHEST WO CONTRAST performed on 01/10/2022 Continued further decrease in the cavitary lesion noted previously in the anterior right upper lobe. There is no persistent cavitary component on today's  study with a residual bandlike area of consolidative volume loss in the region today. The ill-defined patchy and nodular airspace disease seen peripherally towards the right lung apex has nearly resolved. As on prior studies, the right upper lobe bronchus remains effaced. Aortic atherosclerosis  Impression and Plan:  Patrick Dorothyann Peng. has been referred for pre-anesthesia review and clearance prior to him undergoing the planned anesthetic and procedural courses. Available labs, pertinent testing, and imaging results were personally reviewed by me in preparation for upcoming operative/procedural course. [facility] medical record has been updated following extensive record review and patient interview with PAT staff.   This patient has been appropriately cleared by cardiology (LOW) and internal medicine (MODERATE) with the individually indicated risk of significant perioperative complications. Completed perioperative prescription for cardiac device management documentation completed by primary cardiology team and placed on patient's chart for review by the surgical/anesthetic team on the day of his procedure. Electrophysiology indicating that procedure may interfere with planned surgical procedure and that magnet should be placed over device during the procedure. Beyond normal perioperative cardiovascular monitoring, and the aforementioned magnet placement, there are no recommendations from electrophysiology team that prompt further discussion/recommendations from industry representative.   Based on clinical review performed today (02/28/23), barring any significant acute changes in the patient's overall condition, it is anticipated that he will be able to proceed with the planned surgical intervention. Any acute changes in clinical condition may necessitate his procedure being postponed and/or cancelled. Patient will meet with anesthesia team (MD and/or CRNA) on the day of his procedure for preoperative  evaluation/assessment. Questions regarding anesthetic course will be fielded at that time.   Pre-surgical instructions were reviewed with the patient during his PAT appointment, and questions were fielded to satisfaction by PAT clinical staff. He has been instructed on which medications that he will need to hold prior to surgery, as well as the ones that have been deemed safe/appropriate to take of the day of his procedure. As part of the general education provided by PAT, patient  made aware both verbally and in writing, that he would need to abstain from the use of any illegal substances during his perioperative course.  He was advised that failure to follow the provided instructions could necessitate case cancellation or result serious perioperative complications up to and including death. Patient encouraged to contact PAT and/or his surgeon's office to discuss any questions or concerns that may arise prior to surgery; verbalized understanding.   Honor Loh, MSN, APRN, FNP-C, CEN [facility]  Peri-operative Services Nurse Practitioner Phone: [phone] Fax: [phone] 02/28/23 10:58 AM  NOTE: This note has been prepared using Dragon dictation software. Despite my best ability to proofread, there is always the potential that unintentional transcriptional errors may still occur from this process.

## 2023-03-01 MED ORDER — FAMOTIDINE 20 MG PO TABS: 20.0000 mg | ORAL_TABLET | Freq: Once | ORAL | Status: AC

## 2023-03-01 MED ORDER — ORAL CARE MOUTH RINSE: 15.0000 mL | Freq: Once | OROMUCOSAL | Status: AC

## 2023-03-01 MED ORDER — DEXAMETHASONE SODIUM PHOSPHATE 10 MG/ML IJ SOLN
8.0000 mg | Freq: Once | INTRAMUSCULAR | Status: AC
  Administered 2023-03-02: 8 mg via INTRAVENOUS

## 2023-03-01 MED ORDER — TRANEXAMIC ACID-NACL 1000-0.7 MG/100ML-% IV SOLN
1000.0000 mg | INTRAVENOUS | Status: AC
  Administered 2023-03-02 (×2): 1000 mg via INTRAVENOUS

## 2023-03-01 MED ORDER — CEFAZOLIN SODIUM-DEXTROSE 2-4 GM/100ML-% IV SOLN
2.0000 g | INTRAVENOUS | Status: AC
  Administered 2023-03-02: 2 g via INTRAVENOUS
  Filled 2023-03-01: qty 100

## 2023-03-01 MED ORDER — LACTATED RINGERS IV SOLN: INTRAVENOUS | Status: DC

## 2023-03-01 MED ORDER — CHLORHEXIDINE GLUCONATE 0.12 % MT SOLN: 15.0000 mL | Freq: Once | OROMUCOSAL | Status: AC

## 2023-03-02 ENCOUNTER — Encounter
Admission: RE | Disposition: A | Discharge status: Home Health | Payer: Self-pay | Source: Home / Self Care | Attending: Orthopedic Surgery

## 2023-03-02 ENCOUNTER — Ambulatory Visit: Payer: Medicare Other

## 2023-03-02 ENCOUNTER — Ambulatory Visit: Payer: Medicare Other | Admitting: Urgent Care

## 2023-03-02 ENCOUNTER — Encounter: Payer: Self-pay | Admitting: Orthopedic Surgery

## 2023-03-02 ENCOUNTER — Other Ambulatory Visit: Payer: Self-pay

## 2023-03-02 ENCOUNTER — Observation Stay
Admission: RE | Admit: 2023-03-02 | Discharge: 2023-03-03 | Disposition: A | Discharge status: Home Health | Payer: Medicare Other | Attending: Orthopedic Surgery | Admitting: Orthopedic Surgery

## 2023-03-02 DIAGNOSIS — I110 Hypertensive heart disease with heart failure: Secondary | ICD-10-CM | POA: Diagnosis not present

## 2023-03-02 DIAGNOSIS — Z96651 Presence of right artificial knee joint: Secondary | ICD-10-CM

## 2023-03-02 DIAGNOSIS — J449 Chronic obstructive pulmonary disease, unspecified: Secondary | ICD-10-CM | POA: Diagnosis not present

## 2023-03-02 DIAGNOSIS — I5022 Chronic systolic (congestive) heart failure: Secondary | ICD-10-CM | POA: Diagnosis not present

## 2023-03-02 DIAGNOSIS — Z950 Presence of cardiac pacemaker: Secondary | ICD-10-CM | POA: Diagnosis not present

## 2023-03-02 DIAGNOSIS — Z7982 Long term (current) use of aspirin: Secondary | ICD-10-CM | POA: Diagnosis not present

## 2023-03-02 DIAGNOSIS — Z79899 Other long term (current) drug therapy: Secondary | ICD-10-CM | POA: Insufficient documentation

## 2023-03-02 DIAGNOSIS — M1711 Unilateral primary osteoarthritis, right knee: Principal | ICD-10-CM | POA: Insufficient documentation

## 2023-03-02 DIAGNOSIS — J45909 Unspecified asthma, uncomplicated: Secondary | ICD-10-CM | POA: Insufficient documentation

## 2023-03-02 HISTORY — DX: Other cardiomyopathies: I42.8

## 2023-03-02 HISTORY — DX: Atherosclerosis of aorta: I70.0

## 2023-03-02 HISTORY — DX: Cataract extraction status, right eye: Z98.41

## 2023-03-02 HISTORY — DX: Cataract extraction status, left eye: Z98.42

## 2023-03-02 HISTORY — PX: TOTAL KNEE ARTHROPLASTY: SHX125

## 2023-03-02 LAB — ABO/RH: ABO/RH(D): O POS

## 2023-03-02 SURGERY — ARTHROPLASTY, KNEE, TOTAL
Anesthesia: Spinal | Site: Knee | Laterality: Right

## 2023-03-02 MED ORDER — FAMOTIDINE 20 MG PO TABS
ORAL_TABLET | ORAL | Status: AC
  Administered 2023-03-02: 20 mg via ORAL
  Filled 2023-03-02: qty 1

## 2023-03-02 MED ORDER — LATANOPROST 0.005 % OP SOLN: 1.0000 [drp] | Freq: Every day | OPHTHALMIC | Status: DC

## 2023-03-02 MED ORDER — EPHEDRINE SULFATE (PRESSORS) 50 MG/ML IJ SOLN
INTRAMUSCULAR | Status: DC | PRN
  Administered 2023-03-02: 5 mg via INTRAVENOUS
  Administered 2023-03-02: 10 mg via INTRAVENOUS

## 2023-03-02 MED ORDER — METOCLOPRAMIDE HCL 5 MG PO TABS: 5.0000 mg | ORAL_TABLET | Freq: Three times a day (TID) | ORAL | Status: DC | PRN

## 2023-03-02 MED ORDER — MONTELUKAST SODIUM 10 MG PO TABS
10.0000 mg | ORAL_TABLET | Freq: Every evening | ORAL | Status: DC
  Administered 2023-03-02: 10 mg via ORAL
  Filled 2023-03-02 (×2): qty 1

## 2023-03-02 MED ORDER — PROPOFOL 10 MG/ML IV BOLUS
INTRAVENOUS | Status: DC | PRN
  Administered 2023-03-02: 20 mg via INTRAVENOUS

## 2023-03-02 MED ORDER — CARVEDILOL 12.5 MG PO TABS
ORAL_TABLET | ORAL | Status: AC
  Administered 2023-03-02: 12.5 mg via ORAL
  Filled 2023-03-02: qty 1

## 2023-03-02 MED ORDER — DOCUSATE SODIUM 100 MG PO CAPS
ORAL_CAPSULE | ORAL | Status: AC
  Administered 2023-03-02: 100 mg via ORAL
  Filled 2023-03-02: qty 1

## 2023-03-02 MED ORDER — PHENYLEPHRINE 80 MCG/ML (10ML) SYRINGE FOR IV PUSH (FOR BLOOD PRESSURE SUPPORT)
PREFILLED_SYRINGE | INTRAVENOUS | Status: AC
  Filled 2023-03-02: qty 10

## 2023-03-02 MED ORDER — OXYCODONE HCL 5 MG PO TABS: 5.0000 mg | ORAL_TABLET | Freq: Once | ORAL | Status: DC | PRN

## 2023-03-02 MED ORDER — DOCUSATE SODIUM 100 MG PO CAPS: 100.0000 mg | ORAL_CAPSULE | Freq: Two times a day (BID) | ORAL | Status: DC

## 2023-03-02 MED ORDER — SODIUM CHLORIDE 0.9 % IV SOLN
INTRAVENOUS | Status: DC
  Administered 2023-03-02: 900 mL via INTRAVENOUS

## 2023-03-02 MED ORDER — ASPIRIN 81 MG PO CHEW: 81.0000 mg | CHEWABLE_TABLET | Freq: Every day | ORAL | Status: DC

## 2023-03-02 MED ORDER — PANTOPRAZOLE SODIUM 40 MG PO TBEC: 40.0000 mg | DELAYED_RELEASE_TABLET | Freq: Every day | ORAL | Status: DC

## 2023-03-02 MED ORDER — PANTOPRAZOLE SODIUM 40 MG PO TBEC
DELAYED_RELEASE_TABLET | ORAL | Status: AC
  Administered 2023-03-02: 40 mg via ORAL
  Filled 2023-03-02: qty 1

## 2023-03-02 MED ORDER — ACETAMINOPHEN 500 MG PO TABS
ORAL_TABLET | ORAL | Status: AC
  Administered 2023-03-02: 1000 mg via ORAL
  Filled 2023-03-02: qty 2

## 2023-03-02 MED ORDER — BUPIVACAINE LIPOSOME 1.3 % IJ SUSP
INTRAMUSCULAR | Status: AC
  Filled 2023-03-02: qty 40

## 2023-03-02 MED ORDER — BUPIVACAINE HCL (PF) 0.25 % IJ SOLN
INTRAMUSCULAR | Status: AC
  Filled 2023-03-02: qty 60

## 2023-03-02 MED ORDER — HYDROCODONE-ACETAMINOPHEN 5-325 MG PO TABS
ORAL_TABLET | ORAL | Status: AC
  Administered 2023-03-02: 2 via ORAL
  Filled 2023-03-02: qty 2

## 2023-03-02 MED ORDER — SENNOSIDES-DOCUSATE SODIUM 8.6-50 MG PO TABS
ORAL_TABLET | ORAL | Status: AC
  Administered 2023-03-02: 1 via ORAL
  Filled 2023-03-02: qty 1

## 2023-03-02 MED ORDER — DEXAMETHASONE SODIUM PHOSPHATE 10 MG/ML IJ SOLN
INTRAMUSCULAR | Status: AC
  Filled 2023-03-02: qty 1

## 2023-03-02 MED ORDER — CEFAZOLIN SODIUM-DEXTROSE 2-4 GM/100ML-% IV SOLN
INTRAVENOUS | Status: AC
  Filled 2023-03-02: qty 100

## 2023-03-02 MED ORDER — TRAMADOL HCL 50 MG PO TABS: 50.0000 mg | ORAL_TABLET | Freq: Four times a day (QID) | ORAL | Status: DC | PRN

## 2023-03-02 MED ORDER — CEFAZOLIN SODIUM-DEXTROSE 2-4 GM/100ML-% IV SOLN
INTRAVENOUS | Status: AC
  Administered 2023-03-02: 2000 mg
  Filled 2023-03-02: qty 100

## 2023-03-02 MED ORDER — OXYCODONE HCL 5 MG PO TABS
ORAL_TABLET | ORAL | Status: AC
  Administered 2023-03-02: 5 mg via ORAL
  Filled 2023-03-02: qty 1

## 2023-03-02 MED ORDER — METOCLOPRAMIDE HCL 5 MG/ML IJ SOLN: 5.0000 mg | Freq: Three times a day (TID) | INTRAMUSCULAR | Status: DC | PRN

## 2023-03-02 MED ORDER — SODIUM CHLORIDE 0.9 % IR SOLN
Status: DC | PRN
  Administered 2023-03-02: 3000 mL

## 2023-03-02 MED ORDER — PHENYLEPHRINE HCL (PRESSORS) 10 MG/ML IV SOLN
INTRAVENOUS | Status: DC | PRN
  Administered 2023-03-02: 80 ug via INTRAVENOUS
  Administered 2023-03-02: 160 ug via INTRAVENOUS

## 2023-03-02 MED ORDER — BUPIVACAINE HCL (PF) 0.5 % IJ SOLN
INTRAMUSCULAR | Status: AC
  Filled 2023-03-02: qty 10

## 2023-03-02 MED ORDER — EPHEDRINE 5 MG/ML INJ
INTRAVENOUS | Status: AC
  Filled 2023-03-02: qty 5

## 2023-03-02 MED ORDER — ACETAMINOPHEN 500 MG PO TABS
1000.0000 mg | ORAL_TABLET | Freq: Three times a day (TID) | ORAL | Status: DC
  Administered 2023-03-03: 1000 mg via ORAL

## 2023-03-02 MED ORDER — BUPIVACAINE HCL (PF) 0.5 % IJ SOLN
INTRAMUSCULAR | Status: DC | PRN
  Administered 2023-03-02: 2.5 mL

## 2023-03-02 MED ORDER — PROMETHAZINE HCL 25 MG/ML IJ SOLN: 6.2500 mg | INTRAMUSCULAR | Status: DC | PRN

## 2023-03-02 MED ORDER — MORPHINE SULFATE (PF) 4 MG/ML IV SOLN: 0.5000 mg | INTRAVENOUS | Status: DC | PRN

## 2023-03-02 MED ORDER — PHENOL 1.4 % MT LIQD: 1.0000 | OROMUCOSAL | Status: DC | PRN

## 2023-03-02 MED ORDER — CEFAZOLIN SODIUM-DEXTROSE 2-4 GM/100ML-% IV SOLN
2.0000 g | Freq: Four times a day (QID) | INTRAVENOUS | Status: AC
  Administered 2023-03-02: 2 g via INTRAVENOUS

## 2023-03-02 MED ORDER — DROPERIDOL 2.5 MG/ML IJ SOLN: 0.6250 mg | Freq: Once | INTRAMUSCULAR | Status: DC | PRN

## 2023-03-02 MED ORDER — SENNOSIDES-DOCUSATE SODIUM 8.6-50 MG PO TABS: 1.0000 | ORAL_TABLET | Freq: Every day | ORAL | Status: DC

## 2023-03-02 MED ORDER — ASPIRIN 81 MG PO CHEW
CHEWABLE_TABLET | ORAL | Status: AC
  Administered 2023-03-02: 81 mg via ORAL
  Filled 2023-03-02: qty 1

## 2023-03-02 MED ORDER — PHENYLEPHRINE HCL-NACL 20-0.9 MG/250ML-% IV SOLN
INTRAVENOUS | Status: DC | PRN
  Administered 2023-03-02: 25 ug/min via INTRAVENOUS

## 2023-03-02 MED ORDER — ALBUTEROL SULFATE (2.5 MG/3ML) 0.083% IN NEBU: 3.0000 mL | INHALATION_SOLUTION | Freq: Four times a day (QID) | RESPIRATORY_TRACT | Status: DC | PRN

## 2023-03-02 MED ORDER — PHENYLEPHRINE HCL-NACL 20-0.9 MG/250ML-% IV SOLN
INTRAVENOUS | Status: AC
  Filled 2023-03-02: qty 500

## 2023-03-02 MED ORDER — SACUBITRIL-VALSARTAN 24-26 MG PO TABS
1.0000 | ORAL_TABLET | Freq: Two times a day (BID) | ORAL | Status: DC
  Administered 2023-03-02 (×2): 1 via ORAL
  Filled 2023-03-02 (×3): qty 1

## 2023-03-02 MED ORDER — EPINEPHRINE PF 1 MG/ML IJ SOLN
INTRAMUSCULAR | Status: AC
  Filled 2023-03-02: qty 2

## 2023-03-02 MED ORDER — CARVEDILOL 12.5 MG PO TABS
12.5000 mg | ORAL_TABLET | Freq: Two times a day (BID) | ORAL | Status: DC
  Administered 2023-03-03: 12.5 mg via ORAL

## 2023-03-02 MED ORDER — FENTANYL CITRATE (PF) 100 MCG/2ML IJ SOLN: 25.0000 ug | INTRAMUSCULAR | Status: DC | PRN

## 2023-03-02 MED ORDER — PROPOFOL 500 MG/50ML IV EMUL
INTRAVENOUS | Status: DC | PRN
  Administered 2023-03-02: 75 ug/kg/min via INTRAVENOUS

## 2023-03-02 MED ORDER — ONDANSETRON HCL 4 MG PO TABS: 4.0000 mg | ORAL_TABLET | Freq: Four times a day (QID) | ORAL | Status: DC | PRN

## 2023-03-02 MED ORDER — MORPHINE SULFATE (PF) 2 MG/ML IV SOLN
INTRAVENOUS | Status: AC
  Administered 2023-03-03: 1 mg via INTRAVENOUS
  Filled 2023-03-02: qty 1

## 2023-03-02 MED ORDER — ACETAMINOPHEN 10 MG/ML IV SOLN
INTRAVENOUS | Status: DC | PRN
  Administered 2023-03-02: 1000 mg via INTRAVENOUS

## 2023-03-02 MED ORDER — OXYCODONE HCL 5 MG/5ML PO SOLN: 5.0000 mg | Freq: Once | ORAL | Status: DC | PRN

## 2023-03-02 MED ORDER — ACETAMINOPHEN 10 MG/ML IV SOLN
INTRAVENOUS | Status: AC
  Filled 2023-03-02: qty 200

## 2023-03-02 MED ORDER — PROPOFOL 1000 MG/100ML IV EMUL
INTRAVENOUS | Status: AC
  Filled 2023-03-02: qty 100

## 2023-03-02 MED ORDER — ONDANSETRON HCL 4 MG/2ML IJ SOLN: 4.0000 mg | Freq: Four times a day (QID) | INTRAMUSCULAR | Status: DC | PRN

## 2023-03-02 MED ORDER — OXYCODONE HCL 5 MG PO TABS: 5.0000 mg | ORAL_TABLET | Freq: Once | ORAL | Status: AC

## 2023-03-02 MED ORDER — ACETAMINOPHEN 10 MG/ML IV SOLN: 1000.0000 mg | Freq: Once | INTRAVENOUS | Status: DC | PRN

## 2023-03-02 MED ORDER — TRANEXAMIC ACID 1000 MG/10ML IV SOLN
INTRAVENOUS | Status: AC
  Filled 2023-03-02: qty 40

## 2023-03-02 MED ORDER — CHLORHEXIDINE GLUCONATE 0.12 % MT SOLN
OROMUCOSAL | Status: AC
  Administered 2023-03-02: 15 mL via OROMUCOSAL
  Filled 2023-03-02: qty 15

## 2023-03-02 MED ORDER — MENTHOL 3 MG MT LOZG: 1.0000 | LOZENGE | OROMUCOSAL | Status: DC | PRN

## 2023-03-02 MED ORDER — IRRISEPT - 450ML BOTTLE WITH 0.05% CHG IN STERILE WATER, USP 99.95% OPTIME
TOPICAL | Status: DC | PRN
  Administered 2023-03-02: 450 mL

## 2023-03-02 MED ORDER — SODIUM CHLORIDE (PF) 0.9 % IJ SOLN
INTRAMUSCULAR | Status: DC | PRN
  Administered 2023-03-02: 70 mL via INTRAMUSCULAR

## 2023-03-02 MED ORDER — ENOXAPARIN SODIUM 30 MG/0.3ML IJ SOSY
30.0000 mg | PREFILLED_SYRINGE | Freq: Two times a day (BID) | INTRAMUSCULAR | Status: DC
  Administered 2023-03-03: 30 mg via SUBCUTANEOUS

## 2023-03-02 MED ORDER — TRANEXAMIC ACID-NACL 1000-0.7 MG/100ML-% IV SOLN
INTRAVENOUS | Status: AC
  Filled 2023-03-02: qty 100

## 2023-03-02 MED ORDER — HYDROCODONE-ACETAMINOPHEN 5-325 MG PO TABS
1.0000 | ORAL_TABLET | ORAL | Status: DC | PRN
  Administered 2023-03-03: 2 via ORAL

## 2023-03-02 SURGICAL SUPPLY — 82 items
ADH SKN CLS APL DERMABOND .7 (GAUZE/BANDAGES/DRESSINGS) ×1
APL PRP STRL LF DISP 70% ISPRP (MISCELLANEOUS) ×2
BLADE PATELLA W-PILOT HOLE 35 (BLADE) IMPLANT
BLADE SAW SAG 25X90X1.19 (BLADE) ×1 IMPLANT
BLADE SAW SAG 29X58X.64 (BLADE) ×1 IMPLANT
BNDG CMPR 5X62 HK CLSR LF (GAUZE/BANDAGES/DRESSINGS) ×1
BNDG ELASTIC 6INX 5YD STR LF (GAUZE/BANDAGES/DRESSINGS) ×1 IMPLANT
BOWL CEMENT MIX W/ADAPTER (MISCELLANEOUS) ×1 IMPLANT
BSPLAT TIB 5D G CMNT STM RT (Knees) ×1 IMPLANT
CEMENT BONE R 1X40 (Cement) ×2 IMPLANT
CHLORAPREP W/TINT 26 (MISCELLANEOUS) ×2 IMPLANT
COOLER POLAR GLACIER W/PUMP (MISCELLANEOUS) ×1 IMPLANT
CUFF TOURN SGL QUICK 24 (TOURNIQUET CUFF)
CUFF TOURN SGL QUICK 34 (TOURNIQUET CUFF)
CUFF TRNQT CYL 24X4X16.5-23 (TOURNIQUET CUFF) IMPLANT
CUFF TRNQT CYL 34X4.125X (TOURNIQUET CUFF) IMPLANT
DERMABOND ADVANCED .7 DNX12 (GAUZE/BANDAGES/DRESSINGS) ×1 IMPLANT
DRAPE 3/4 80X56 (DRAPES) ×1 IMPLANT
DRAPE INCISE IOBAN 66X60 STRL (DRAPES) ×1 IMPLANT
DRSG MEPILEX SACRM 8.7X9.8 (GAUZE/BANDAGES/DRESSINGS) ×1 IMPLANT
DRSG OPSITE POSTOP 4X10 (GAUZE/BANDAGES/DRESSINGS) IMPLANT
DRSG OPSITE POSTOP 4X8 (GAUZE/BANDAGES/DRESSINGS) IMPLANT
ELECT CAUTERY BLADE 6.4 (BLADE) ×1 IMPLANT
ELECT REM PT RETURN 9FT ADLT (ELECTROSURGICAL) ×1
ELECTRODE REM PT RTRN 9FT ADLT (ELECTROSURGICAL) ×1 IMPLANT
FEMUR CMT CR STD SZ 8 RT KNEE (Joint) ×1 IMPLANT
FEMUR CMTD CR STD SZ 8 RT KNEE (Joint) IMPLANT
GLOVE BIO SURGEON STRL SZ8 (GLOVE) ×1 IMPLANT
GLOVE BIOGEL PI IND STRL 8 (GLOVE) ×1 IMPLANT
GLOVE PI ORTHO PRO STRL 7.5 (GLOVE) ×2 IMPLANT
GLOVE PI ORTHO PRO STRL SZ8 (GLOVE) ×2 IMPLANT
GOWN STRL REUS W/ TWL LRG LVL3 (GOWN DISPOSABLE) ×1 IMPLANT
GOWN STRL REUS W/ TWL XL LVL3 (GOWN DISPOSABLE) ×2 IMPLANT
GOWN STRL REUS W/TWL LRG LVL3 (GOWN DISPOSABLE) ×1
GOWN STRL REUS W/TWL XL LVL3 (GOWN DISPOSABLE) ×2
HANDLE YANKAUER SUCT OPEN TIP (MISCELLANEOUS) ×1 IMPLANT
HDLS TROCR DRIL PIN KNEE 75 (PIN) ×1
HOLDER FOLEY CATH W/STRAP (MISCELLANEOUS) ×1 IMPLANT
HOOD PEEL AWAY T7 (MISCELLANEOUS) ×3 IMPLANT
INSERT TIB ASF PS 14 8-11 (Insert) IMPLANT
IV NS IRRIG 3000ML ARTHROMATIC (IV SOLUTION) ×1 IMPLANT
JET LAVAGE IRRISEPT WOUND (IRRIGATION / IRRIGATOR) ×1
KIT TURNOVER KIT A (KITS) ×1 IMPLANT
LAVAGE JET IRRISEPT WOUND (IRRIGATION / IRRIGATOR) IMPLANT
MANIFOLD NEPTUNE II (INSTRUMENTS) ×1 IMPLANT
MARKER SKIN DUAL TIP RULER LAB (MISCELLANEOUS) ×1 IMPLANT
MAT ABSORB  FLUID 56X50 GRAY (MISCELLANEOUS) ×1
MAT ABSORB FLUID 56X50 GRAY (MISCELLANEOUS) ×1 IMPLANT
NDL HYPO 21X1.5 SAFETY (NEEDLE) ×1 IMPLANT
NDL SAFETY ECLIP 18X1.5 (MISCELLANEOUS) ×1 IMPLANT
NEEDLE HYPO 21X1.5 SAFETY (NEEDLE) ×1 IMPLANT
NS IRRIG 500ML POUR BTL (IV SOLUTION) ×1 IMPLANT
PACK TOTAL KNEE (MISCELLANEOUS) ×1 IMPLANT
PAD WRAPON POLAR KNEE (MISCELLANEOUS) ×1 IMPLANT
PENCIL SMOKE EVACUATOR (MISCELLANEOUS) ×1 IMPLANT
PIN DRILL HDLS TROCAR 75 4PK (PIN) IMPLANT
PULSAVAC PLUS IRRIG FAN TIP (DISPOSABLE) ×1
SCREW FEMALE HEX FIX 25X2.5 (ORTHOPEDIC DISPOSABLE SUPPLIES) IMPLANT
SCREW HEX HEADED 3.5X27 DISP (ORTHOPEDIC DISPOSABLE SUPPLIES) IMPLANT
SLEEVE SCD COMPRESS KNEE MED (STOCKING) ×1 IMPLANT
SOLUTION IRRIG SURGIPHOR (IV SOLUTION) ×1 IMPLANT
STEM POLY PAT PLY 38M KNEE (Knees) IMPLANT
STEM TIB ST PERS 14+30 (Stem) IMPLANT
STEM TIBIA 5 DEG SZ G R KNEE (Knees) IMPLANT
SUT DVC 2 QUILL PDO  T11 36X36 (SUTURE) ×1
SUT DVC 2 QUILL PDO T11 36X36 (SUTURE) ×1 IMPLANT
SUT QUILL MONODERM 3-0 PS-2 (SUTURE) ×1 IMPLANT
SUT VIC AB 0 CT1 36 (SUTURE) ×1 IMPLANT
SUT VIC AB 2-0 CT2 27 (SUTURE) ×2 IMPLANT
SUT VICRYL 1-0 27IN ABS (SUTURE) ×1
SUTURE VICRYL 1-0 27IN ABS (SUTURE) ×1 IMPLANT
SYR 10ML LL (SYRINGE) ×1 IMPLANT
SYR 30ML LL (SYRINGE) ×2 IMPLANT
SYR 3ML LL SCALE MARK (SYRINGE) ×1 IMPLANT
TAPE CLOTH 3X10 WHT NS LF (GAUZE/BANDAGES/DRESSINGS) ×1 IMPLANT
TIBIA STEM 5 DEG SZ G R KNEE (Knees) ×1 IMPLANT
TIP FAN IRRIG PULSAVAC PLUS (DISPOSABLE) ×1 IMPLANT
TOWEL OR 17X26 4PK STRL BLUE (TOWEL DISPOSABLE) IMPLANT
TRAP FLUID SMOKE EVACUATOR (MISCELLANEOUS) ×1 IMPLANT
TRAY FOLEY SLVR 16FR LF STAT (SET/KITS/TRAYS/PACK) ×1 IMPLANT
WATER STERILE IRR 1000ML POUR (IV SOLUTION) ×1 IMPLANT
WRAPON POLAR PAD KNEE (MISCELLANEOUS) ×1

## 2023-03-02 NOTE — Interval H&P Note (Signed)
Patient history and physical updated. Consent reviewed including risks, benefits, and alternatives to surgery. Patient agrees with above plan to proceed with right total knee arthroplasty  

## 2023-03-02 NOTE — Transfer of Care (Signed)
Immediate Anesthesia Transfer of Care Note  Patient: Nemesio Phanthavong.  Procedure(s) Performed: TOTAL KNEE ARTHROPLASTY (Right: Knee)  Patient Location: PACU  Anesthesia Type:General and Spinal  Level of Consciousness: drowsy  Airway & Oxygen Therapy: Patient Spontanous Breathing  Post-op Assessment: Report given to RN and Post -op Vital signs reviewed and stable  Post vital signs: Reviewed and stable  Last Vitals:  Vitals Value Taken Time  BP 93/68 03/02/23 0951  Temp    Pulse 77 03/02/23 0954  Resp 18 03/02/23 0954  SpO2 97 % 03/02/23 0954  Vitals shown include unvalidated device data.  Last Pain:  Vitals:   03/02/23 0634  TempSrc: Temporal  PainSc: 0-No pain         Complications: No notable events documented.

## 2023-03-02 NOTE — H&P (Signed)
Chief Complaint  Patient presents with  Pre-op Exam  Right TKA - 03/02/23 By Dr. Laban Emperor Patrick Figueroa is a 87 y.o. male who presents today for history and physical for right total knee arthroplasty with Dr. Karel Jarvis on 03/02/2023. X-rays of the patient's right knee from 11/22/2022 show severe degenerative changes of the right knee joint most severe in the lateral compartment with complete loss of joint space and valgus deformity. Moderate joint space narrowing and arthritic changes present in the medial compartment. Severe degenerative changes also present in the patellofemoral compartment. Patient's pain in his right knee has been present for at least 10 years. His pain has been progressing yearly. His pain has now progressed to the point where he is unable to perform activities of daily living. He has been treated with cortisone injections gel injections, meloxicam, aspirin, Tylenol with little to no relief. He is using a knee sleeve daily to help with stability. Patient has had physical therapy with no improvement. Patient's pain and is functionally disabling and preventing him from forming simple activities such as standing and walking.  Patient's BMI is 35.4, last hemoglobin A1c 6.3, the patient is a non-smoker  Past Medical History: Past Medical History:  Diagnosis Date  Allergic conjunctivitis  Allergic rhinitis  Allergic state  Environmental.  Anemia  Bladder neck obstruction  Chronic systolic CHF (congestive heart failure) (CMS-HCC)  Complete heart block (CMS-HCC)  s/p pacemaker implant in 2009.  Complete heart block (CMS-HCC)  Dilated cardiomyopathy (CMS-HCC)  with ejection fraction of 30%  History of colon polyps  Hx of adenomatous colonic polyps 06/24/2016  Hyperglycemia  Hyperlipidemia  Hypertension  benign  Osteoarthritis  Osteoarthrosis  Pacemaker  Reactive airway disease   Past Surgical History: Past Surgical History:  Procedure Laterality Date  COLONOSCOPY  04/11/2006  02/06/2003; Adenomatous Polyp  Pacemaker implant 2009  COLONOSCOPY 07/06/2011  PH Adenomatous Polyps: CBF 06/2016; Recall Ltr mailed 04/26/2016 (dw); Would not recommend a routine surveillance given his cardiac condition per RTE (dw)  REMOVAL ICD ELECTRODES N/A 06/25/2018  Procedure: Laser REMOVAL OF SINGLE OR DUAL CHAMBER IMPLANTABLE DEFIBRILLATOR ELECTRODE(S); BY TRANSVENOUS EXTRACTION; Surgeon: Yolanda Manges, MD; Location: [facility]; Service: Cardiothoracic; Laterality: N/A;  TRANSVENOUS INSERTION ICD ELECTRODES N/A 06/25/2018  Procedure: INSERTION OR REPLACEMENT OF PERMANENT IMPLANTABLE DEFIBRILLATOR SYSTEM, WITH TRANSVENOUS LEAD(S), SINGLE OR DUAL CHAMBER; Surgeon: Yolanda Manges, MD; Location: [facility]; Service: Cardiothoracic; Laterality: N/A;  TRANSTHORACIC MEDIASTINOTOMY N/A 06/25/2018  Procedure: MEDIASTINOTOMY WITH EXPLORATION, DRAINAGE, REMOVAL OF FOREIGN BODY, OR BIOPSY; TRANSTHORACIC APPROACH, INCLUDING EITHER TRANSTHORACIC OR MEDIAN STERNOTOMY, for open lead extraction, possible; Surgeon: Theresia Majors, MD; Location: [facility]; Service: Cardiothoracic; Laterality: N/A;   Past Family History: Family History  Problem Relation Age of Onset  Myocardial Infarction (Heart attack) Sister  Sister recently had early myocardial infarction with coronary bypass graft  Myocardial Infarction (Heart attack) Mother  Coronary Artery Disease (Blocked arteries around heart) Other  High blood pressure (Hypertension) Other  Rheum arthritis Other   Medications: Current Outpatient Medications Ordered in Epic  Medication Sig Dispense Refill  acetaminophen (TYLENOL) 500 MG tablet Take 1,000 mg by mouth every 8 (eight) hours as needed for Pain  acetylcysteine (MUCOMYST) 20 % solution for inhalation Inhale 4 mLs into the lungs 2 (two) times daily DIAGNOISIS: J47.9 720 mL 2  albuterol (ACCUNEB) 1.25 mg/3 mL nebulizer solution Take 3 mLs (1.25 mg  total) by nebulization 4 (four) times daily 1080 mL 3  albuterol 90 mcg/actuation inhaler Inhale  2 inhalations into the lungs every 6 (six) hours as needed for Wheezing 200 g 10  aspirin 81 MG EC tablet Take 81 mg by mouth once daily.  azelastine (OPTIVAR) 0.05 % ophthalmic solution INSTILL 1 DROP IN BOTH EYES TWICE A DAY 18 mL 4  azithromycin (ZITHROMAX) 250 MG tablet Take 1 tablet (250 mg total) by mouth every Monday, Wednesday, and Friday for 39 doses 39 tablet 2  carvediloL (COREG) 12.5 MG tablet TAKE 1 TABLET TWICE A DAY WITH MEALS 180 tablet 3  docusate (COLACE) 100 MG capsule Take 100 mg by mouth nightly as needed for Constipation  ENTRESTO 24-26 mg tablet TAKE 1 TABLET BY MOUTH EVERY 12 HOURS 180 tablet 3  fluticasone propionate (FLONASE) 50 mcg/actuation nasal spray USE 2 SPRAYS IN EACH NOSTRIL ONCE DAILY 48 g 3  guaiFENesin (MUCINEX) 600 mg SR tablet Take 600 mg by mouth every 12 (twelve) hours  latanoprost (XALATAN) 0.005 % ophthalmic solution Place 1 drop into both eyes at bedtime  levocetirizine (XYZAL) 5 MG tablet TAKE 1 TABLET EVERY EVENING 90 tablet 3  meloxicam (MOBIC) 15 MG tablet Take 1 tablet (15 mg total) by mouth once daily as needed 90 tablet 1  montelukast (SINGULAIR) 10 mg tablet Take 1 tablet (10 mg total) by mouth once daily 90 tablet 3  psyllium husk, with sugar, (METAMUCIL) 3.4 gram/12 gram oral powder Take 1.7 g by mouth once daily Mix with a full glass (240 mL) of fluid.  sennosides-docusate (SENOKOT-S) 8.6-50 mg tablet Take 1 tablet by mouth once daily  TORsemide (DEMADEX) 20 MG tablet TAKE 1 TABLET TWICE A DAY 180 tablet 3   No current Epic-ordered facility-administered medications on file.   Allergies: No Known Allergies   Review of Systems:  A comprehensive 14 point ROS was performed, reviewed by me today, and the pertinent orthopaedic findings are documented in the HPI.  Exam: BP (!) 156/84  Ht 167.6 cm ('5\' 6"'$ )  Wt (!) 103.2 kg (227 lb 9.6 oz)  BMI  36.74 kg/m  General:  Well developed, well nourished, no apparent distress, normal affect, antalgic gait with a cane  HEENT: Head normocephalic, atraumatic, PERRL.   Abdomen: Soft, non tender, non distended, Bowel sounds present.  Heart: Examination of the heart reveals regular, rate, and rhythm. There is no murmur noted on ascultation. There is a normal apical pulse.  Lungs: Lungs are clear to auscultation. There is no wheeze, rhonchi, or crackles. There is normal expansion of bilateral chest walls.   Comprehensive Knee Exam: Gait Antalgic and stiff  Alignment Right knee valgus left knee varus, mild windswept deformity   Inspection Right Left  Skin Normal appearance with no obvious deformity. No ecchymosis or erythema. Normal appearance with no obvious deformity. No ecchymosis or erythema.  Soft Tissue No focal soft tissue swelling No focal soft tissue swelling  Quad Atrophy None None   Palpation  Right Left  Tenderness Significant medial and lateral joint line tenderness to palpation Minimal tenderness to palpation  Crepitus + patellofemoral and tibiofemoral crepitus + patellofemoral and tibiofemoral crepitus  Effusion None None   Range of Motion              Right     Left  Flexion 5-110    0-115  Extension 5 degree flexion contracture Full knee extension without hyperextension   Ligamentous Exam Right Left  Lachman Normal Normal  Valgus 0 Normal Normal  Valgus 30 Normal Normal  Varus 0 Normal Normal  Varus 30  Normal Normal  Anterior Drawer Normal Normal  Posterior Drawer Normal Normal   Meniscal Exam Right Left  Hyperflexion Test Positive Negative  Hyperextension Test Positive Negative  McMurray's Positive Negative   Neurovascular Right Left  Quadriceps Strength 5/5 5/5  Hamstring Strength 5/5 5/5  Hip Abductor Strength 4/5 4/5  Distal Motor Normal Normal  Distal Sensory Normal light touch sensation Normal light touch sensation  Distal Pulses Normal  Normal    Imaging Studies: I have reviewed AP, lateral, sunrise, flex PA x-rays of the right knee taken on 11/22/2022. Images show severe lateral joint space narrowing with bone-on-bone articulation, osteophyte formation, sclerosis, subchondral cyst formation to the right knee. There are also severe degenerative changes the patellofemoral joint bone-on-bone articulation. The knee is in 10 degrees of valgus as measured on AP film. The right knee is Kellgren-Lawrence grade 4. AP, flexed PA, sunrise of the left knee show medial joint space narrowing with osteophyte formation, sclerosis, subchondral cyst formation as well as patellofemoral joint space narrowing. The left knee is Kellgren-Lawrence grade 3. No fracture dislocations noted either knee  Impression: Primary osteoarthritis of right knee [M17.11] Primary osteoarthritis of right knee (primary encounter diagnosis)  Plan:  69. 87 year old male with advanced right knee degenerative changes, tricompartmental with complete loss of joint space in the lateral compartment with valgus deformity. Patient's pain is severe and debilitating. He is unable to perform activities daily living and his pain is affecting his quality of life. He has tried all forms of conservative treatment and no improvement. Risks, benefits, complications of a right total knee arthroplasty have been discussed with the patient. Patient has agreed and consented to a right total knee arthroplasty with Dr. Karel Jarvis on 03/02/2023.  The hospitalization and post-operative care and rehabilitation were also discussed. The use of perioperative antibiotics and DVT prophylaxis were discussed. The risk, benefits and alternatives to a surgical intervention were discussed at length with the patient. The patient was also advised of risks related to the medical comorbidities and elevated body mass index (BMI). A lengthy discussion took place to review the most common complications including but not  limited to: stiffness, loss of function, complex regional pain syndrome, deep vein thrombosis, pulmonary embolus, heart attack, stroke, infection, wound breakdown, numbness, intraoperative fracture, damage to nerves, tendon,muscles, arteries or other blood vessels, death and other possible complications from anesthesia. The patient was told that we will take steps to minimize these risks by using sterile technique, antibiotics and DVT prophylaxis when appropriate and follow the patient postoperatively in the office setting to monitor progress. The possibility of recurrent pain, no improvement in pain and actual worsening of pain were also discussed with the patient.   This note was generated in part with voice recognition software and I apologize for any typographical errors that were not detected and corrected.

## 2023-03-02 NOTE — Anesthesia Preprocedure Evaluation (Signed)
Anesthesia Evaluation  Patient identified by MRN, date of birth, ID band Patient awake    Reviewed: Allergy & Precautions, H&P , NPO status , Patient's Chart, lab work & pertinent test results, reviewed documented beta blocker date and time   Airway Mallampati: III   Neck ROM: full    Dental  (+) Poor Dentition, Teeth Intact   Pulmonary neg shortness of breath, asthma , pneumonia, COPD   Pulmonary exam normal        Cardiovascular Exercise Tolerance: Poor hypertension, +CHF  (-) Orthopnea and (-) PND Normal cardiovascular exam+ dysrhythmias + Cardiac Defibrillator  Rhythm:regular Rate:Normal     Neuro/Psych negative neurological ROS  negative psych ROS   GI/Hepatic negative GI ROS, Neg liver ROS,,,  Endo/Other  negative endocrine ROS    Renal/GU Renal disease  negative genitourinary   Musculoskeletal   Abdominal   Peds  Hematology  (+) Blood dyscrasia, anemia   Anesthesia Other Findings Past Medical History: 06/25/2018: AICD (automatic cardioverter/defibrillator) present     Comment:  a.) Medtronic Claria MRI Quad BIV ICD with MRI               conditional leads No date: Anemia No date: Aortic atherosclerosis (HCC) No date: Arthritis No date: Asthma No date: Benign essential hypertension No date: Bronchiectasis with (acute) exacerbation (HCC)     Comment:  Mounier-Kuhn No date: CHF (congestive heart failure), NYHA class III, chronic,  systolic (HCC)     Comment:  a.) TTE 05/02/2016: EF 25%, mod LVE, sev glob HK, LAE,               triv PR, mild MR/TR; b.) TTE 04/27/2018: EF 20%, mod LVE,              sev glob HK, BAE, RVE, triv AR/PR, mild MR/TR; c.) TTE               01/10/2019: EF 40%, mild glob HK, triv AR/PR, mild MR/TR;              d.) TTE 04/28/2021: EF 50% triv AR, mild MR/TR; e.) TTE               12/07/2022: EF >55%, triv MR, mild TR, G1DD No date: Complete heart block (HCC)     Comment:  a.)  s/p PPM placement 2009 for SSS/CHB; b.) upgraded to               CRT-D 06/25/2018 (Medtronic Claria MRI Quad BIV ICD with               MRI conditional leads) No date: COPD (chronic obstructive pulmonary disease) ([place]) No date: Elevated PSA No date: Environmental allergies No date: History of bilateral cataract extraction 2009: History of cardiac pacemaker     Comment:  a.) placed in 2009; removed and upgraded to CRT-D device              06/25/2018 No date: Hx of adenomatous colonic polyps No date: Mixed hyperlipidemia No date: Necrotizing pneumonia ([place]) No date: NICM (nonischemic cardiomyopathy) ([place])     Comment:  a.) TTE 05/02/2016: EF 25%; b.) TTE 04/27/2018: EF 20%;               c.) CRT-D placed 06/25/2018: d.) TTE 01/10/2019: EF 40%;               e.) TTE 04/28/2021: EF 50%; f.) TTE 12/07/2022: EF >55% No date: Osteoarthritis Past Surgical History: 06/25/2018: [place];  N/A     Comment:  Procedure: INSERTION OR REPLACEMENT OF PERMANENT               IMPLANTABLE DEFIBRILLATOR SYSTEM, WITH TRANSVENOUS               LEAD(S), SINGLE OR DUAL CHAMBER; Location: [facility] 12/09/2015: CATARACT EXTRACTION W/PHACO; Right     Comment:  Procedure: CATARACT EXTRACTION PHACO AND INTRAOCULAR               LENS PLACEMENT (IOC);  Surgeon: Leandrew Koyanagi, MD;               Location: [facility];  Service: Ophthalmology;                Laterality: Right; 01/13/2016: CATARACT EXTRACTION W/PHACO; Left     Comment:  Procedure: CATARACT EXTRACTION PHACO AND INTRAOCULAR               LENS PLACEMENT (IOC);  Surgeon: Leandrew Koyanagi, MD;               Location: [facility];  Service: Ophthalmology;                Laterality: Left; 2007: COLONOSCOPY; N/A 2012: COLONOSCOPY; N/A 06/25/2018: MEDIASTINOTOMY; N/A     Comment:  Procedure: MEDIASTINOTOMY WITH EXPLORATION, DRAINAGE,               REMOVAL OF FOREIGN BODY, OR BIOPSY; TRANSTHORACIC                APPROACH, INCLUDING EITHER TRANSTHORACIC OR MEDIAN               STERNOTOMY, for open lead extraction; Location: [facility] 09/29/2008: PACEMAKER INSERTION 06/25/2018: PACEMAKER REMOVAL; N/A     Comment:  Procedure: REMOVAL OF SINGLE OR DUAL CHAMBER IMPLANTABLE              DEFIBRILLATOR ELECTRODE(S); BY TRANSVENOUS EXTRACTION;               Location: [facility] 08/26/2021: VIDEO BRONCHOSCOPY; Bilateral     Comment:  Procedure: VIDEO BRONCHOSCOPY;  Surgeon: Ottie Glazier, MD;  Location: [facility];  Service: Thoracic;                Laterality: Bilateral; BMI    Body Mass Index: 36.32 kg/m     Reproductive/Obstetrics negative OB ROS                             Anesthesia Physical Anesthesia Plan  ASA: 3  Anesthesia Plan: Spinal   Post-op Pain Management:    Induction:   PONV Risk Score and Plan:   Airway Management Planned:   Additional Equipment:   Intra-op Plan:   Post-operative Plan:   Informed Consent: I have reviewed the patients History and Physical, chart, labs and discussed the procedure including the risks, benefits and alternatives for the proposed anesthesia with the patient or authorized representative who has indicated his/her understanding and acceptance.     Dental Advisory Given  Plan Discussed with: CRNA  Anesthesia Plan Comments:        Anesthesia Quick Evaluation

## 2023-03-02 NOTE — Anesthesia Procedure Notes (Signed)
Spinal  Patient location during procedure: OR Start time: 03/02/2023 7:36 AM End time: 03/02/2023 7:45 AM Reason for block: surgical anesthesia Staffing Performed: resident/CRNA  Resident/CRNA: Lia Foyer, CRNA Performed by: Lia Foyer, CRNA Authorized by: Molli Barrows, MD   Preanesthetic Checklist Completed: patient identified, IV checked, site marked, risks and benefits discussed, surgical consent, monitors and equipment checked, pre-op evaluation and timeout performed Spinal Block Patient position: sitting Prep: DuraPrep Patient monitoring: heart rate, cardiac monitor, continuous pulse ox and blood pressure Approach: midline Location: L3-4 Injection technique: single-shot Needle Needle type: Pencan  Needle gauge: 25 G Needle length: 9 cm Assessment Sensory level: T4 Events: CSF return

## 2023-03-02 NOTE — Evaluation (Signed)
Physical Therapy Evaluation Patient Details Name: Patrick Figueroa. MRN: [id number] DOB: [birth year] Today's Date: 03/02/2023  History of Present Illness  Pt is an 87 y/o M admitted on 03/02/23 for scheduled R TKA. PMH: chronic systolic CHF, pacemaker, HLD, HTN, OA  Clinical Impression  Pt seen for PT evaluation with pt agreeable to tx. Pt reports prior to admission he was mod I with SPC, still driving, denies falls, & was living in a multilevel home with 3 steps with R rail to enter. PT provides pt with HEP handout & pt performs RLE strengthening/AROM exercises with cuing for technique. Pt is able to complete bed mobility with mod I, sit<>stand with CGA. Pt ambulates ~40 ft with RW & CGA with forward trunk flexion & gait pattern as noted below. Educated pt on use of polar care for pain management as well as need to promote R knee extension & no pillows under R knee. Pt left in recliner with call bell in reach, family arriving.      Recommendations for follow up therapy are one component of a multi-disciplinary discharge planning process, led by the attending physician.  Recommendations may be updated based on patient status, additional functional criteria and insurance authorization.  Follow Up Recommendations Home health PT      Assistance Recommended at Discharge Intermittent Supervision/Assistance  Patient can return home with the following  A little help with walking and/or transfers;A little help with bathing/dressing/bathroom;Assistance with cooking/housework;Assist for transportation;Help with stairs or ramp for entrance    Equipment Recommendations None recommended by PT (pt reports he has a RW at home)  Recommendations for Other Services       Functional Status Assessment Patient has had a recent decline in their functional status and demonstrates the ability to make significant improvements in function in a reasonable and predictable amount of time.     Precautions / Restrictions  Precautions Precautions: Fall Restrictions Weight Bearing Restrictions: No      Mobility  Bed Mobility Overal bed mobility: Modified Independent             General bed mobility comments: supine>sit with HOB elevated, bed rails PRN    Transfers Overall transfer level: Needs assistance Equipment used: Rolling walker (2 wheels) Transfers: Sit to/from Stand Sit to Stand: Supervision           General transfer comment: education re: hand placement during STS    Ambulation/Gait Ambulation/Gait assistance: Min guard Gait Distance (Feet): 40 Feet Assistive device: Rolling walker (2 wheels) Gait Pattern/deviations: Decreased step length - left, Decreased step length - right, Decreased stride length, Decreased dorsiflexion - right, Decreased dorsiflexion - left, Trunk flexed Gait velocity: decreased     General Gait Details: PT provides education re: upright posture but pt continues to demonstrate flexed trunk, PT also educates pt on need to ambulate within base of AD. Pt with decreased weight shift L<>R, decreased gait speed.  Stairs            Wheelchair Mobility    Modified Rankin (Stroke Patients Only)       Balance Overall balance assessment: Needs assistance Sitting-balance support: Feet supported Sitting balance-Leahy Scale: Good     Standing balance support: During functional activity, No upper extremity supported Standing balance-Leahy Scale: Fair Standing balance comment: CGA<>supervision for static standing without BUE support                             Pertinent Vitals/Pain  Pain Assessment Pain Assessment: Faces Faces Pain Scale: Hurts little more Pain Location: posterior R knee Pain Descriptors / Indicators: Discomfort Pain Intervention(s): Monitored during session, Ice applied (polar care donned at end of session)    Home Living Family/patient expects to be discharged to:: Private residence Living Arrangements:  Spouse/significant other Available Help at Discharge: Family Type of Home: House Home Access: Stairs to enter Entrance Stairs-Rails: Right Entrance Stairs-Number of Steps: 3 Alternate Level Stairs-Number of Steps: 1 step down to family room, 1 step down to another room Home Layout: Multi-level Home Equipment: Conservation officer, nature (2 wheels);Cane - single point      Prior Function Prior Level of Function : Independent/Modified Independent             Mobility Comments: Pt reports he's mod I with SPC, still driving, denies falls.       Hand Dominance        Extremity/Trunk Assessment   Upper Extremity Assessment Upper Extremity Assessment: Overall WFL for tasks assessed    Lower Extremity Assessment Lower Extremity Assessment: Overall WFL for tasks assessed       Communication   Communication: No difficulties  Cognition Arousal/Alertness: Awake/alert Behavior During Therapy: WFL for tasks assessed/performed Overall Cognitive Status: Within Functional Limits for tasks assessed                                          General Comments      Exercises Total Joint Exercises Ankle Circles/Pumps: AROM, Supine, Right, 10 reps Quad Sets: AROM, Supine, Strengthening, Right, 10 reps Short Arc Quad: AROM, Supine, Strengthening, Right, 10 reps Heel Slides: AROM, Supine, Strengthening, Right, 10 reps Hip ABduction/ADduction: AROM, Supine, Strengthening, Right, 10 reps Straight Leg Raises: AROM, Supine, Strengthening, Right, 10 reps Long Arc Quad: AROM, Seated, Strengthening, Right, 10 reps Knee Flexion: AROM, Seated, Strengthening, Right, 10 reps Goniometric ROM: ~100 degrees knee flexion, ~0 degrees full extension   Assessment/Plan    PT Assessment Patient needs continued PT services  PT Problem List Decreased strength;Pain;Decreased activity tolerance;Decreased knowledge of use of DME;Decreased balance;Decreased safety awareness;Decreased mobility;Decreased  knowledge of precautions       PT Treatment Interventions DME instruction;Therapeutic exercise;Balance training;Gait training;Stair training;Neuromuscular re-education;Functional mobility training;Therapeutic activities;Patient/family education    PT Goals (Current goals can be found in the Care Plan section)  Acute Rehab PT Goals Patient Stated Goal: get better, recover s/p R TKA PT Goal Formulation: With patient Time For Goal Achievement: 03/16/23 Potential to Achieve Goals: Good    Frequency BID     Co-evaluation               AM-PAC PT "6 Clicks" Mobility  Outcome Measure Help needed turning from your back to your side while in a flat bed without using bedrails?: None Help needed moving from lying on your back to sitting on the side of a flat bed without using bedrails?: A Little Help needed moving to and from a bed to a chair (including a wheelchair)?: A Little Help needed standing up from a chair using your arms (e.g., wheelchair or bedside chair)?: A Little Help needed to walk in hospital room?: A Little Help needed climbing 3-5 steps with a railing? : A Lot 6 Click Score: 18    End of Session   Activity Tolerance: Patient tolerated treatment well Patient left: in chair;with call bell/phone within reach Nurse Communication: Mobility status  PT Visit Diagnosis: Difficulty in walking, not elsewhere classified (R26.2);Pain;Other abnormalities of gait and mobility (R26.89);Muscle weakness (generalized) (M62.81) Pain - Right/Left: Right Pain - part of body: Knee    Time: [id number] PT Time Calculation (min) (ACUTE ONLY): 22 min   Charges:   PT Evaluation $PT Eval Low Complexity: 1 Low PT Treatments $Therapeutic Exercise: 8-22 mins        Lavone Nian, PT, DPT 03/02/23, 3:07 PM   Waunita Schooner 03/02/2023, 3:05 PM

## 2023-03-02 NOTE — Discharge Summary (Signed)
Physician Discharge Summary  Patient ID: Patrick Figueroa. MRN: [id number] DOB/AGE: 85-Jul-1937 85 y.o.  Admit date: 03/02/2023 Discharge date: 03/03/2023  Admission Diagnoses:  S/P TKR (total knee replacement) using cement, right [Z96.651]   Discharge Diagnoses: Patient Active Problem List   Diagnosis Date Noted   S/P TKR (total knee replacement) using cement, right 03/02/2023   Necrotizing pneumonia ([place]) 08/24/2021   AKI (acute kidney injury) ([place]) 08/24/2021   Aortic atherosclerosis ([place]) 03/01/2021   Elevated PSA 10/29/2019   Mounier-Kuhn bronchiectasis with acute exacerbation ([place]) 03/28/2019   COPD (chronic obstructive pulmonary disease) ([place]) 05/14/2018   Nonischemic cardiomyopathy ([place]) 05/11/2018   Reactive airway disease 07/25/2017   Mixed hyperlipidemia 07/25/2017   Hyperglycemia, unspecified 07/25/2017   Complete heart block ([place]) 07/25/2017   Allergic state 07/25/2017   Allergic rhinitis 07/25/2017   Allergic conjunctivitis 07/25/2017   Bilateral leg edema 04/13/2017   Hx of adenomatous colonic polyps 06/24/2016   Cardiac pacemaker 10/05/2015   Benign essential hypertension [id number]   Chronic systolic CHF (congestive heart failure), NYHA class 3 ([place]) 10/21/2014    Past Medical History:  Diagnosis Date   AICD (automatic cardioverter/defibrillator) present 06/25/2018   a.) Medtronic Claria MRI Quad BIV ICD with MRI conditional leads   Anemia    Aortic atherosclerosis (HCC)    Arthritis    Asthma    Benign essential hypertension    Bronchiectasis with (acute) exacerbation ([place])    Mounier-Kuhn   CHF (congestive heart failure), NYHA class III, chronic, systolic ([place])    a.) TTE 05/02/2016: EF 25%, mod LVE, sev glob HK, LAE, triv PR, mild MR/TR; b.) TTE 04/27/2018: EF 20%, mod LVE, sev glob HK, BAE, RVE, triv AR/PR, mild MR/TR; c.) TTE 01/10/2019: EF 40%, mild glob HK, triv AR/PR, mild MR/TR; d.) TTE 04/28/2021: EF 50% triv AR, mild MR/TR; e.) TTE 12/07/2022: EF  >55%, triv MR, mild TR, G1DD   Complete heart block ([place])    a.) s/p PPM placement 2009 for SSS/CHB; b.) upgraded to CRT-D 06/25/2018 (Medtronic Claria MRI Quad BIV ICD with MRI conditional leads)   COPD (chronic obstructive pulmonary disease) (HCC)    Elevated PSA    Environmental allergies    History of bilateral cataract extraction    History of cardiac pacemaker 2009   a.) placed in 2009; removed and upgraded to CRT-D device 06/25/2018   Hx of adenomatous colonic polyps    Mixed hyperlipidemia    Necrotizing pneumonia (HCC)    NICM (nonischemic cardiomyopathy) ([place])    a.) TTE 05/02/2016: EF 25%; b.) TTE 04/27/2018: EF 20%; c.) CRT-D placed 06/25/2018: d.) TTE 01/10/2019: EF 40%; e.) TTE 04/28/2021: EF 50%; f.) TTE 12/07/2022: EF >55%   Osteoarthritis      Transfusion: none   Consultants (if any):   Discharged Condition: Improved  Hospital Course: Patrick Figueroa. is an 87 y.o. male who was admitted 03/02/2023 with a diagnosis of S/P TKR (total knee replacement) using cement, right and went to the operating room on 03/02/2023 and underwent the above named procedures.    Surgeries: Procedure(s): TOTAL KNEE ARTHROPLASTY on 03/02/2023 Patient tolerated the surgery well. Taken to PACU where she was stabilized and then transferred to the orthopedic floor.  Started on Lovenox 30 mg q 12 hrs. TEDs and SCDs applied bilaterally. Heels elevated on bed. No evidence of DVT. Negative [place]. Physical therapy started on day #1 for gait training and transfer. OT started day #1 for ADL and assisted devices. Patient's IV was d/c on  day #1. Patient was able to safely and independently complete all PT goals. PT recommending discharge to home.  On post op day #1 patient was stable and ready for discharge to home with HHPT.  Implants: Femur: Persona Size 8 CR   Tibia: Persona Size G w/ 14x5mm stem extension  Poly: 62mm MC  Patella: 38x9.32mm Symmetric cemented   He was given perioperative antibiotics:   Anti-infectives (From admission, onward)    Start     Dose/Rate Route Frequency Ordered Stop   03/02/23 2204  ceFAZolin (ANCEF) 2-4 GM/100ML-% IVPB       Note to Pharmacy: Sylvester Harder P: cabinet override      03/02/23 2204 03/02/23 2207   03/02/23 1400  ceFAZolin (ANCEF) IVPB 2g/100 mL premix        2 g 200 mL/hr over 30 Minutes Intravenous Every 6 hours 03/02/23 1018 03/02/23 2230   03/02/23 0600  ceFAZolin (ANCEF) IVPB 2g/100 mL premix        2 g 200 mL/hr over 30 Minutes Intravenous On call to O.R. 03/01/23 2146 03/02/23 0757     .  He was given sequential compression devices, early ambulation, and Lovenox TEDs for DVT prophylaxis.  He benefited maximally from the hospital stay and there were no complications.    Recent vital signs:  Vitals:   03/03/23 0323 03/03/23 0832  BP: (!) 142/75 (!) 141/87  Pulse: 61 62  Resp: 14   Temp:  98.6 F (37 C)  SpO2: 99% 96%    Recent laboratory studies:  Lab Results  Component Value Date   HGB 12.5 (L) 03/03/2023   HGB 13.2 02/17/2023   HGB 11.0 (L) 08/25/2021   Lab Results  Component Value Date   WBC 12.2 (H) 03/03/2023   PLT 171 03/03/2023   No results found for: "INR" Lab Results  Component Value Date   NA 138 03/03/2023   K 4.5 03/03/2023   CL 106 03/03/2023   CO2 23 03/03/2023   BUN 19 03/03/2023   CREATININE 1.15 03/03/2023   GLUCOSE 161 (H) 03/03/2023    Discharge Medications:   Allergies as of 03/03/2023   No Known Allergies      Medication List     STOP taking these medications    acetaminophen 650 MG CR tablet Commonly known as: TYLENOL Replaced by: acetaminophen 500 MG tablet   meloxicam 15 MG tablet Commonly known as: MOBIC       TAKE these medications    acetaminophen 500 MG tablet Commonly known as: TYLENOL Take 2 tablets (1,000 mg total) by mouth every 8 (eight) hours. Replaces: acetaminophen 650 MG CR tablet   acetylcysteine 20 % nebulizer solution Commonly known as:  MUCOMYST Take 4 mLs by nebulization 2 (two) times daily as needed.   albuterol 1.25 MG/3ML nebulizer solution Commonly known as: ACCUNEB Inhale 1 ampule into the lungs every 6 (six) hours as needed.   aspirin 81 MG tablet Take 81 mg by mouth daily. AM   azelastine 0.05 % ophthalmic solution Commonly known as: OPTIVAR Place 1 drop into both eyes 2 (two) times daily.   azithromycin 250 MG tablet Commonly known as: ZITHROMAX Take 250 mg by mouth 3 (three) times a week. Monday, Wednesday, Friday   carvedilol 12.5 MG tablet Commonly known as: COREG Take 12.5 mg by mouth 2 (two) times daily with a meal.   cetirizine 10 MG tablet Commonly known as: ZYRTEC Take 10 mg by mouth daily as needed for allergies.  enoxaparin 40 MG/0.4ML injection Commonly known as: LOVENOX Inject 0.4 mLs (40 mg total) into the skin daily for 14 days.   Entresto 24-26 MG Generic drug: sacubitril-valsartan Take 1 tablet by mouth 2 (two) times daily.   Ex-Lax Maximum Strength 25 MG Tabs Generic drug: Sennosides Take 1 tablet by mouth as needed.   fluticasone 50 MCG/ACT nasal spray Commonly known as: FLONASE Place 2 sprays into both nostrils daily.   guaiFENesin 600 MG 12 hr tablet Commonly known as: MUCINEX Take 600 mg by mouth 2 (two) times daily as needed for to loosen phlegm.   latanoprost 0.005 % ophthalmic solution Commonly known as: XALATAN Place 1 drop into the right eye at bedtime.   montelukast 10 MG tablet Commonly known as: SINGULAIR Take 10 mg by mouth every evening.   ondansetron 4 MG tablet Commonly known as: ZOFRAN Take 1 tablet (4 mg total) by mouth every 6 (six) hours as needed for nausea.   oxyCODONE 5 MG immediate release tablet Commonly known as: Roxicodone Take 0.5 tablets (2.5 mg total) by mouth every 8 (eight) hours as needed for breakthrough pain.   senna-docusate 8.6-50 MG tablet Commonly known as: Senokot-S Take 1 tablet by mouth daily.   torsemide 20 MG  tablet Commonly known as: DEMADEX Take 1 tablet by mouth 2 (two) times daily as needed.   traMADol 50 MG tablet Commonly known as: ULTRAM Take 1 tablet (50 mg total) by mouth every 6 (six) hours as needed for moderate pain.        Diagnostic Studies: DG Knee Right Port  Result Date: 03/02/2023 CLINICAL DATA:  Post knee replacement surgery EXAM: PORTABLE RIGHT KNEE - 1-2 VIEW COMPARISON:  None Available. FINDINGS: Components of knee arthroplasty project in expected location. No fracture or dislocation. Normal alignment. IMPRESSION: Right knee arthroplasty without apparent complication. Electronically Signed   By: Lucrezia Europe M.D.   On: 03/02/2023 10:19    Disposition:      Follow-up Information     Duanne Guess, PA-C Follow up in 2 week(s).   Specialties: Orthopedic Surgery, Emergency Medicine Contact information: [place] [address] [phone]                  Signed: Feliberto Gottron 03/03/2023, 12:13 PM

## 2023-03-02 NOTE — Op Note (Signed)
Patient Name: Patrick Figueroa  MRN:[id number]  Pre-Operative Diagnosis: Right knee Osteoarthritis  Post-Operative Diagnosis: (same)  Procedure: Right Total Knee Arthroplasty  Components/Implants: Femur: Persona Size 8 CR   Tibia: Persona Size G w/ 14x84m stem extension  Poly: 152mMC  Patella: 38x9.71m47mymmetric cemented  Femoral Valgus Cut Angle: 5 degrees  Distal Femoral Re-cut: none  Patella Resurfacing: yes   Date of Surgery: 03/02/2023  Surgeon: ZacSteffanie Rainwater  Assistant: ThoDorise Hiss (present and scrubbed throughout the case, critical for assistance with exposure, retraction, instrumentation, and closure)   Anesthesiologist: Adams  Anesthesia: Spinal  Tourniquet Time: 55 min  EBL: 25cc  IVF: [id number]: None   Brief history: The patient is a 86 66ar old male with a history of osteoarthritis of the right knee with pain limiting their range of motion and activities of daily living, which has failed multiple attempts at conservative therapy.  The risks and benefits of total knee arthroplasty as definitive surgical treatment were discussed with the patient, who opted to proceed with the operation.  After outpatient medical clearance and optimization was completed the [facility]  All preoperative films were reviewed and an appropriate surgical plan was made prior to surgery. Preoperative range of motion was 5 to 110 with a 5 degree flexion contracture. The patient was identified as having a valgus alignment.   Description of procedure: The patient was brought to the operating room where laterality was confirmed by all those present to be the right side.   Spinal anesthesia was administered and the patient received an intravenous dose of antibiotics for surgical prophylaxis and a dose of tranexamic acid.  Patient is positioned supine on the operating room table with all bony prominences  well-padded.  A well-padded tourniquet was applied to the right thigh.  The knee was then prepped and draped in usual sterile fashion with multiple layers of adhesive and nonadhesive drapes.  All of those present in the operating room participated in a surgical timeout laterality and patient were confirmed.   An Esmarch was wrapped around the extremity and the leg was elevated and the knee flexed.  The tourniquet was inflated to a pressure of 275 mmHg. The Esmarch was removed and the leg was brought down to full extension.  The patella and tibial tubercle identified and outlined using a marking pen and a midline skin incision was made with a knife carried through the subcutaneous tissue down to the extensor retinaculum.  After exposure of the extensor mechanism the medial parapatellar arthrotomy was performed with a scalpel and electrocautery extending down medial and distal to the tibial tubercle taking care to avoid incising the patellar tendon.   A standard medial release was performed over the proximal tibia.  The knee was brought into extension in order to excise the fat pad taking care not to damage the patella tendon.  The superior soft tissue was removed from the anterior surface of the distal femur to visualize for the procedure.  The knee was then brought into flexion with the patella subluxed laterally and subluxing the tibia anteriorly.  The ACL was transected and removed with electrocautery and additional soft tissue was removed from the proximal surface of the tibia to fully expose. The PCL was found to be intact and was preserved.  An extramedullary tibial cutting guide was then applied to the leg with a spring-loaded ankle clamp placed around the distal tibia just above the malleoli the angulation  of the guide was adjusted to give some posterior slope in the tibial resection with an appropriate varus/valgus alignment.  The resection guide was then pinned to the proximal tibia and the proximal  tibial surface was resected with an oscillating saw.  Careful attention was paid to ensure the blade did not disrupt any of the soft tissues including any lateral or medial ligament.  Attention was then turned to the femur, with the knee slightly flexed a opening drill was used to enter the medullary canal of the femur.  After removing the drill marrow was suctioned out to decompress the distal femur.  An intramedullary femoral guide was then inserted into the drill hole and the alignment guide was seated firmly against the distal end of the medial femoral condyle.  The distal femoral cutting guide was then attached and pinned securely to the anterior surface of the femur and the intramedullary rod and alignment guide was removed.  Distal femur resection was then performed with an oscillating saw with retractors protecting medial and laterally.   The distal cutting block was then removed and the extension gap was checked with a spacer.  Extension gap was found to be appropriately sized to accommodate the spacer block.   The femoral sizing guide was then placed securely into the posterior condyles of the femur and the femoral size was measured and determined to be 8.  The size 8; 4-in-1 cutting guide was placed in position and secured with 2 pins.  The anterior posterior and chamfer resections were then performed with an oscillating saw.  Bony fragments and osteophytes were then removed.  Using a lamina spreader the posterior medial and lateral condyles were checked for additional osteophytes and posterior soft tissue remnants.  Any remaining meniscus was removed at this time.  Periarticular injection was performed in the meniscal rims and posterior capsule with aspiration performed to ensure no intravascular injection.   The tibia was then exposed and the tibial trial was pinned onto the plateau after confirming appropriate orientation and rotation.  Using the drill bushing the tibia was prepared to the  appropriate drill depth.  Tibial broach impactor was then driven through the punch guide using a mallet.  The femoral trial component was then inserted onto the femur. A trial tibial polyethylene bearing was then placed and the knee was reduced.  The knee achieved full extension with no hyperextension and was found to be balanced in flexion and extension with the trials in place.  The knee was then brought into full extension the patella was everted and held with 2 Kocher clamps.  The articular surface of the patella was then resected with an patella reamer and saw after careful measurement with a caliper.  The patella was then prepared with the drill guide and a trial patella was placed.  The knee was then taken through range of motion and it was found that the patella articulated appropriately with the trochlea and good patellofemoral motion without subluxation.    The correct final components for implantation were confirmed and opened by the circulator nurse.  The prepared surfaces of the patella femur and tibia were cleaned with pulsatile lavage to remove all blood fat and other material and then the surfaces were dried.  2 bags of cement were mixed under vacuum and the components were cemented into place.  Excess cement was removed with curettes and forceps. A trial polyethylene tibial component was placed and the knee was brought into extension to allow the cement to  set.  At this time the periarticular injection cocktail was placed in the soft tissues surrounding the knee.  After full curing of the cement the balance of the knee was checked again and the final polyethylene size was confirmed. The tibial component was irrigated and locking mechanism checked to ensure it was clear of debris. The real polyethylene tibial component was implanted and the knee was brought through a range of motion.   The knee was then irrigated with copious amount of normal saline via pulsatile lavage to remove all loose bodies  and other debris.  The knee was then irrigated with irrisept wash and reirrigated with saline.  The tourniquet was then dropped and all bleeding vessels were identified and coagulated.  The arthrotomy was approximated with #1 Vicryl and closed with #2 Quill suture.  The knee was brought into slight flexion and the subcutaneous tissues were closed with 0 Vicryl, 2-0 Vicryl and a running subcuticular 3-0 monoderm quil suture.  Skin was then glued with Dermabond.  A sterile adhesive dressing was then placed along with a sequential compression device to the calf, a Ted stocking, and a cryotherapy cuff.   Sponge, needle, and Lap counts were all correct at the end of the case.   The patient was transferred off of the operating room table to a hospital bed, good pulses were found distally on the operative side.  The patient was transferred to the recovery room in stable condition.

## 2023-03-02 NOTE — Discharge Instructions (Signed)
 Instructions after Total Knee Replacement   Zachary Aberman M.D.     Dept. of Orthopaedics & Sports Medicine  [facility]  [address]  [place], [place]  [address]  Phone: [phone]   Fax: [phone]    DIET: Drink plenty of non-alcoholic fluids. Resume your normal diet. Include foods high in fiber.  ACTIVITY:  You may use crutches or a walker with weight-bearing as tolerated, unless instructed otherwise. You may be weaned off of the walker or crutches by your Physical Therapist.  Do NOT place pillows under the knee. Anything placed under the knee could limit your ability to straighten the knee.   Continue doing gentle exercises. Exercising will reduce the pain and swelling, increase motion, and prevent muscle weakness.   Please continue to use the TED compression stockings for 2 weeks. You may remove the stockings at night, but should reapply them in the morning. Do not drive or operate any equipment until instructed.  WOUND CARE:  Continue to use the PolarCare or ice packs periodically to reduce pain and swelling. You may begin showering 3 days after surgery with honeycomb dressing. Remove honeycomb dressing 7 days after surgery and continue showering. Allow dermabond to fall off on its own.  MEDICATIONS: You may resume your regular medications. Please take the pain medication as prescribed on the medication. Do not take pain medication on an empty stomach. You have been given a prescription for a blood thinner (Lovenox or Coumadin). Please take the medication as instructed. (NOTE: After completing a 2 week course of Lovenox, take one 81 mg Enteric-coated aspirin twice a day. This along with elevation will help reduce the possibility of phlebitis in your operated leg.) Do not drive or drink alcoholic beverages when taking pain medications.  POSTOPERATIVE CONSTIPATION PROTOCOL Constipation - defined medically as fewer than three stools per week and severe  constipation as less than one stool per week.  One of the most common issues patients have following surgery is constipation.  Even if you have a regular bowel pattern at home, your normal regimen is likely to be disrupted due to multiple reasons following surgery.  Combination of anesthesia, postoperative narcotics, change in appetite and fluid intake all can affect your bowels.  In order to avoid complications following surgery, here are some recommendations in order to help you during your recovery period.  Colace (docusate) - Pick up an over-the-counter form of Colace or another stool softener and take twice a day as long as you are requiring postoperative pain medications.  Take with a full glass of water daily.  If you experience loose stools or diarrhea, hold the colace until you stool forms back up.  If your symptoms do not get better within 1 week or if they get worse, check with your doctor.  Dulcolax (bisacodyl) - Pick up over-the-counter and take as directed by the product packaging as needed to assist with the movement of your bowels.  Take with a full glass of water.  Use this product as needed if not relieved by Colace only.   MiraLax (polyethylene glycol) - Pick up over-the-counter to have on hand.  MiraLax is a solution that will increase the amount of water in your bowels to assist with bowel movements.  Take as directed and can mix with a glass of water, juice, soda, coffee, or tea.  Take if you go more than two days without a movement. Do not use MiraLax more than once per day. Call your doctor if   you are still constipated or irregular after using this medication for 7 days in a row.  If you continue to have problems with postoperative constipation, please contact the office for further assistance and recommendations.  If you experience "the worst abdominal pain ever" or develop nausea or vomiting, please contact the office immediatly for further recommendations for  treatment.   CALL THE OFFICE FOR: Temperature above 101 degrees Excessive bleeding or drainage on the dressing. Excessive swelling, coldness, or paleness of the toes. Persistent nausea and vomiting.  FOLLOW-UP:  You should have an appointment to return to the office in 14 days after surgery. Arrangements have been made for continuation of Physical Therapy (either home therapy or outpatient therapy).  

## 2023-03-03 ENCOUNTER — Encounter: Payer: Self-pay | Admitting: Orthopedic Surgery

## 2023-03-03 DIAGNOSIS — M1711 Unilateral primary osteoarthritis, right knee: Secondary | ICD-10-CM | POA: Diagnosis not present

## 2023-03-03 LAB — BASIC METABOLIC PANEL
Anion gap: 9 (ref 5–15)
BUN: 19 mg/dL (ref 8–23)
CO2: 23 mmol/L (ref 22–32)
Calcium: 8.6 mg/dL — ABNORMAL LOW (ref 8.9–10.3)
Chloride: 106 mmol/L (ref 98–111)
Creatinine, Ser: 1.15 mg/dL (ref 0.61–1.24)
GFR, Estimated: >60 mL/min (ref >60)
Glucose, Bld: 161 mg/dL — ABNORMAL HIGH (ref 70–99)
Potassium: 4.5 mmol/L (ref 3.5–5.1)
Sodium: 138 mmol/L (ref 135–145)

## 2023-03-03 LAB — CBC
HCT: 38.7 % — ABNORMAL LOW (ref 39.0–52.0)
Hemoglobin: 12.5 g/dL — ABNORMAL LOW (ref 13.0–17.0)
MCH: 27.8 pg (ref 26.0–34.0)
MCHC: 32.3 g/dL (ref 30.0–36.0)
MCV: 86.2 fL (ref 80.0–100.0)
Platelets: 171 10*3/uL (ref 150–400)
RBC: 4.49 MIL/uL (ref 4.22–5.81)
RDW: 14 % (ref 11.5–15.5)
WBC: 12.2 10*3/uL — ABNORMAL HIGH (ref 4.0–10.5)
nRBC: 0 % (ref 0.0–0.2)

## 2023-03-03 MED ORDER — CARVEDILOL 12.5 MG PO TABS
ORAL_TABLET | ORAL | Status: AC
  Filled 2023-03-03: qty 1

## 2023-03-03 MED ORDER — OXYCODONE HCL 5 MG PO TABS: 2.5000 mg | ORAL_TABLET | Freq: Three times a day (TID) | ORAL | 0 refills | Status: AC | PRN

## 2023-03-03 MED ORDER — HYDROCODONE-ACETAMINOPHEN 5-325 MG PO TABS
ORAL_TABLET | ORAL | Status: AC
  Filled 2023-03-03: qty 2

## 2023-03-03 MED ORDER — ACETAMINOPHEN 500 MG PO TABS
ORAL_TABLET | ORAL | Status: AC
  Filled 2023-03-03: qty 2

## 2023-03-03 MED ORDER — ENOXAPARIN SODIUM 30 MG/0.3ML IJ SOSY
PREFILLED_SYRINGE | INTRAMUSCULAR | Status: AC
  Filled 2023-03-03: qty 0.3

## 2023-03-03 MED ORDER — CEFAZOLIN SODIUM 1 G IJ SOLR
INTRAMUSCULAR | Status: AC
  Filled 2023-03-03: qty 20

## 2023-03-03 MED ORDER — ONDANSETRON HCL 4 MG PO TABS: 4.0000 mg | ORAL_TABLET | Freq: Four times a day (QID) | ORAL | 0 refills | Status: AC | PRN

## 2023-03-03 MED ORDER — ENOXAPARIN SODIUM 40 MG/0.4ML IJ SOSY: 40.0000 mg | PREFILLED_SYRINGE | INTRAMUSCULAR | 0 refills | Status: DC

## 2023-03-03 MED ORDER — TRAMADOL HCL 50 MG PO TABS: 50.0000 mg | ORAL_TABLET | Freq: Four times a day (QID) | ORAL | 0 refills | Status: DC | PRN

## 2023-03-03 MED ORDER — ACETAMINOPHEN 500 MG PO TABS: 1000.0000 mg | ORAL_TABLET | Freq: Three times a day (TID) | ORAL | 0 refills | Status: AC

## 2023-03-03 NOTE — TOC Progression Note (Signed)
Transition of Care (TOC) - Progression Note    Patient Details  Name: Patrick Figueroa. MRN: [id number] Date of Birth: 01-26-1936  Transition of Care [facility]) CM/SW [place], RN Phone Number: 03/03/2023, 10:17 AM  Clinical Narrative:    The patient is set up with [facility] for [facility], He will get a 3 in 1 delivered to the bedside by [employer]   Expected Discharge Plan: [place] Barriers to Discharge: No Barriers Identified  Expected Discharge Plan and Services   Discharge Planning Services: CM Consult   Living arrangements for the past 2 months: Single Family Home                 DME Arranged: 3-N-1 DME Agency: [employer] Date DME Agency Contacted: 03/03/23 Time DME Agency Contacted: [id number] Representative spoke with at DME Agency: [place]: PT [place]: [place] ([place]) Date [place]: 03/02/23 Time [place]: [place] Representative spoke with at [place]: [place] Determinants of Health ([place]) Interventions SDOH Screenings   Food Insecurity: No Food Insecurity (03/02/2023)  Housing: Low Risk  (03/02/2023)  Transportation Needs: No Transportation Needs (03/02/2023)  Utilities: Not At Risk (03/02/2023)  Tobacco Use: Low Risk  (03/03/2023)    Readmission Risk Interventions     No data to display

## 2023-03-03 NOTE — Progress Notes (Addendum)
Subjective: 1 Day Post-Op Procedure(s) (LRB): TOTAL KNEE ARTHROPLASTY (Right) Patient reports pain as mild.   Patient is well, and has had no acute complaints or problems Denies any CP, SOB, ABD pain. We will continue therapy today.  Plan is to go Home after hospital stay.  Objective: Vital signs in last 24 hours: Temp:  [96.9 F (36.1 C)-98.4 F (36.9 C)] 98 F (36.7 C) (03/14 1929) Pulse Rate:  [61-77] 61 (03/15 0323) Resp:  [14-18] 14 (03/15 0323) BP: (93-145)/(66-88) 142/75 (03/15 0323) SpO2:  [96 %-99 %] 99 % (03/15 0323)  Intake/Output from previous day: 03/14 0701 - 03/15 0700 In: 863.8 [I.V.:563.8; IV Piggyback:300] Out: 1450 [Urine:1425; Blood:25] Intake/Output this shift: No intake/output data recorded.  Recent Labs    03/03/23 0528  HGB 12.5*   Recent Labs    03/03/23 0528  WBC 12.2*  RBC 4.49  HCT 38.7*  PLT 171   Recent Labs    03/03/23 0528  NA 138  K 4.5  CL 106  CO2 23  BUN 19  CREATININE 1.15  GLUCOSE 161*  CALCIUM 8.6*   No results for input(s): "LABPT", "INR" in the last 72 hours.  EXAM General - Patient is Alert, Appropriate, and Oriented Extremity - Neurovascular intact Sensation intact distally Intact pulses distally Dorsiflexion/Plantar flexion intact Dressing - dressing C/D/I and no drainage Motor Function - intact, moving foot and toes well on exam.   Past Medical History:  Diagnosis Date   AICD (automatic cardioverter/defibrillator) present 06/25/2018   a.) Medtronic Claria MRI Quad BIV ICD with MRI conditional leads   Anemia    Aortic atherosclerosis (HCC)    Arthritis    Asthma    Benign essential hypertension    Bronchiectasis with (acute) exacerbation (HCC)    Mounier-Kuhn   CHF (congestive heart failure), NYHA class III, chronic, systolic ([place])    a.) TTE 05/02/2016: EF 25%, mod LVE, sev glob HK, LAE, triv PR, mild MR/TR; b.) TTE 04/27/2018: EF 20%, mod LVE, sev glob HK, BAE, RVE, triv AR/PR, mild MR/TR; c.)  TTE 01/10/2019: EF 40%, mild glob HK, triv AR/PR, mild MR/TR; d.) TTE 04/28/2021: EF 50% triv AR, mild MR/TR; e.) TTE 12/07/2022: EF >55%, triv MR, mild TR, G1DD   Complete heart block ([place])    a.) s/p PPM placement 2009 for SSS/CHB; b.) upgraded to CRT-D 06/25/2018 (Medtronic Claria MRI Quad BIV ICD with MRI conditional leads)   COPD (chronic obstructive pulmonary disease) (HCC)    Elevated PSA    Environmental allergies    History of bilateral cataract extraction    History of cardiac pacemaker 2009   a.) placed in 2009; removed and upgraded to CRT-D device 06/25/2018   Hx of adenomatous colonic polyps    Mixed hyperlipidemia    Necrotizing pneumonia (HCC)    NICM (nonischemic cardiomyopathy) ([place])    a.) TTE 05/02/2016: EF 25%; b.) TTE 04/27/2018: EF 20%; c.) CRT-D placed 06/25/2018: d.) TTE 01/10/2019: EF 40%; e.) TTE 04/28/2021: EF 50%; f.) TTE 12/07/2022: EF >55%   Osteoarthritis     Assessment/Plan:   1 Day Post-Op Procedure(s) (LRB): TOTAL KNEE ARTHROPLASTY (Right) Principal Problem:   S/P TKR (total knee replacement) using cement, right  Estimated body mass index is 36.32 kg/m as calculated from the following:   Height as of this encounter: 5\' 6"  (1.676 m).   Weight as of this encounter: 102.1 kg. Advance diet Up with therapy Pain well-controlled Labs and vital signs are stable. Anticipate discharge to  home with home health PT today  DVT Prophylaxis - Lovenox, TED hose, and SCDs Weight-Bearing as tolerated to right leg   T. Rachelle Hora, PA-C [place] 03/03/2023, 8:19 AM  Patient seen and examined, agree with above plan.  The patient is doing well status post right total knee arthroplasty, no concerns at this time.  Pain is controlled.  Discussed DVT prophylaxis, pain medication use, and safe transition to home.  All questions answered the patient agrees with above plan will go home after clears PT.   Steffanie Rainwater MD

## 2023-03-03 NOTE — Progress Notes (Signed)
Physical Therapy Treatment Patient Details Name: Patrick Figueroa. MRN: [id number] DOB: 1936-02-20 Today's Date: 03/03/2023   History of Present Illness Pt is an 87 y/o M admitted on 03/02/23 for scheduled R TKA. PMH: chronic systolic CHF, pacemaker, HLD, HTN, OA    PT Comments    Patient received up in recliner. He is agreeable to PT session. Patient reports continued pain behind right knee. Otherwise no pain in joint. He is able to stand with supervision. Ambulated 200 feet with RW and min guard. Ambulated up/down 4 steps with B rails and supervision. Patient is making great progress and would like to go home today. He will continue to benefit from skilled PT to improve functional independence and strength.     Recommendations for follow up therapy are one component of a multi-disciplinary discharge planning process, led by the attending physician.  Recommendations may be updated based on patient status, additional functional criteria and insurance authorization.  Follow Up Recommendations  Home health PT     Assistance Recommended at Discharge Intermittent Supervision/Assistance  Patient can return home with the following A little help with walking and/or transfers;A little help with bathing/dressing/bathroom;Assistance with cooking/housework;Assist for transportation;Help with stairs or ramp for entrance   Equipment Recommendations  None recommended by PT    Recommendations for Other Services       Precautions / Restrictions Precautions Precautions: Fall Restrictions Weight Bearing Restrictions: Yes RLE Weight Bearing: Weight bearing as tolerated     Mobility  Bed Mobility               General bed mobility comments: NT, patient received in recliner and remained in recliner    Transfers Overall transfer level: Needs assistance Equipment used: Rolling walker (2 wheels) Transfers: Sit to/from Stand Sit to Stand: Supervision           General transfer comment:  education re: hand placement during STS    Ambulation/Gait Ambulation/Gait assistance: Min guard Gait Distance (Feet): 200 Feet Assistive device: Rolling walker (2 wheels) Gait Pattern/deviations: Decreased step length - left, Decreased step length - right, Decreased stride length, Trunk flexed Gait velocity: decreased     General Gait Details: PT provides education re: upright posture but pt continues to demonstrate flexed trunk, PT also educates pt on need to ambulate within base of AD. Pt with decreased weight shift L<>R, decreased gait speed.   Stairs Stairs: Yes Stairs assistance: Supervision Stair Management: Two rails, Step to pattern, Forwards Number of Stairs: 4 General stair comments: generally safe on steps, cues for sequencing   Wheelchair Mobility    Modified Rankin (Stroke Patients Only)       Balance Overall balance assessment: Needs assistance Sitting-balance support: Feet supported Sitting balance-Leahy Scale: Normal     Standing balance support: Bilateral upper extremity supported, During functional activity, Reliant on assistive device for balance Standing balance-Leahy Scale: Good                              Cognition Arousal/Alertness: Awake/alert Behavior During Therapy: WFL for tasks assessed/performed Overall Cognitive Status: Within Functional Limits for tasks assessed                                          Exercises Total Joint Exercises Ankle Circles/Pumps: AROM, Both, 10 reps Quad Sets: AROM, Right, 5 reps Short Arc  Quad: AROM, Right, 10 reps Heel Slides: AROM, Right, 5 reps Hip ABduction/ADduction: AROM, Right, 5 reps Straight Leg Raises: AROM, Right, 10 reps Long Arc Quad: AROM, Right, 5 reps Goniometric ROM: 0-100    General Comments        Pertinent Vitals/Pain Pain Assessment Pain Assessment: Faces Faces Pain Scale: Hurts a little bit Pain Location: posterior R knee Pain Descriptors /  Indicators: Discomfort Pain Intervention(s): Monitored during session, Repositioned    Home Living                          Prior Function            PT Goals (current goals can now be found in the care plan section) Acute Rehab PT Goals Patient Stated Goal: get better, recover s/p R TKA PT Goal Formulation: With patient Time For Goal Achievement: 03/16/23 Potential to Achieve Goals: Good Progress towards PT goals: Progressing toward goals    Frequency    BID      PT Plan Current plan remains appropriate    Co-evaluation              AM-PAC PT "6 Clicks" Mobility   Outcome Measure  Help needed turning from your back to your side while in a flat bed without using bedrails?: None Help needed moving from lying on your back to sitting on the side of a flat bed without using bedrails?: A Little Help needed moving to and from a bed to a chair (including a wheelchair)?: A Little Help needed standing up from a chair using your arms (e.g., wheelchair or bedside chair)?: A Little Help needed to walk in hospital room?: A Little Help needed climbing 3-5 steps with a railing? : A Little 6 Click Score: 19    End of Session Equipment Utilized During Treatment: Gait belt Activity Tolerance: Patient tolerated treatment well Patient left: in chair;with call bell/phone within reach Nurse Communication: Mobility status PT Visit Diagnosis: Difficulty in walking, not elsewhere classified (R26.2);Pain;Other abnormalities of gait and mobility (R26.89);Muscle weakness (generalized) (M62.81) Pain - Right/Left: Right Pain - part of body: Knee     Time: [id number] PT Time Calculation (min) (ACUTE ONLY): 26 min  Charges:  $Gait Training: 8-22 mins $Therapeutic Exercise: 8-22 mins                     Janelle Culton, PT, GCS 03/03/23,10:29 AM

## 2023-03-03 NOTE — Anesthesia Postprocedure Evaluation (Signed)
Anesthesia Post Note  Patient: Patrick Figueroa.  Procedure(s) Performed: TOTAL KNEE ARTHROPLASTY (Right: Knee)  Patient location during evaluation: Nursing Unit Anesthesia Type: Spinal Level of consciousness: awake Pain management: pain level controlled Respiratory status: spontaneous breathing Cardiovascular status: stable Postop Assessment: no headache Anesthetic complications: no   No notable events documented.   Last Vitals:  Vitals:   03/02/23 1929 03/03/23 0323  BP: 127/74 (!) 142/75  Pulse: 69 61  Resp: 16 14  Temp: 36.7 C   SpO2: 97% 99%    Last Pain:  Vitals:   03/03/23 0641  TempSrc:   PainSc: 7                  Lerry Liner

## 2023-03-03 NOTE — Progress Notes (Signed)
Patient is not able to walk the distance required to go the bathroom, or he/she is unable to safely negotiate stairs required to access the bathroom.  A 3in1 BSC will alleviate this problem  

## 2023-03-21 ENCOUNTER — Other Ambulatory Visit: Payer: Self-pay | Admitting: Orthopedic Surgery

## 2023-03-21 ENCOUNTER — Ambulatory Visit
Admission: RE | Admit: 2023-03-21 | Discharge: 2023-03-21 | Disposition: A | Discharge status: Home / Self Care | Payer: Medicare Other | Source: Ambulatory Visit | Attending: Orthopedic Surgery | Admitting: Orthopedic Surgery

## 2023-03-21 DIAGNOSIS — Z96651 Presence of right artificial knee joint: Secondary | ICD-10-CM

## 2023-04-03 ENCOUNTER — Emergency Department
Admission: EM | Admit: 2023-04-03 | Discharge: 2023-04-04 | Disposition: A | Discharge status: Home / Self Care | Payer: Medicare Other | Attending: Emergency Medicine | Admitting: Emergency Medicine

## 2023-04-03 ENCOUNTER — Other Ambulatory Visit: Payer: Self-pay

## 2023-04-03 DIAGNOSIS — U071 COVID-19: Secondary | ICD-10-CM | POA: Diagnosis not present

## 2023-04-03 DIAGNOSIS — I509 Heart failure, unspecified: Secondary | ICD-10-CM | POA: Insufficient documentation

## 2023-04-03 DIAGNOSIS — Z96651 Presence of right artificial knee joint: Secondary | ICD-10-CM | POA: Insufficient documentation

## 2023-04-03 DIAGNOSIS — R300 Dysuria: Secondary | ICD-10-CM | POA: Insufficient documentation

## 2023-04-03 DIAGNOSIS — R531 Weakness: Secondary | ICD-10-CM

## 2023-04-03 DIAGNOSIS — I110 Hypertensive heart disease with heart failure: Secondary | ICD-10-CM | POA: Insufficient documentation

## 2023-04-03 MED ORDER — SODIUM CHLORIDE 0.9 % IV BOLUS
500.0000 mL | Freq: Once | INTRAVENOUS | Status: AC
  Administered 2023-04-03: 500 mL via INTRAVENOUS

## 2023-04-03 NOTE — ED Triage Notes (Signed)
Pt from home had a R knee surgery on 3/14 and pt reports having increased weakness getting around at home. Pt denies pain to knee. Pt did ambulate with assistance into bed. Pt home health nurse was concerned for hypotension lowest BP being 92/50's. Pt denies CP, SOA, pt is A&Ox4 at this time.

## 2023-04-03 NOTE — ED Provider Notes (Signed)
[facility] Provider Note    Event Date/Time   First MD Initiated Contact with Patient 04/03/23 2336     (approximate)  History   Chief Complaint: Weakness  HPI  Patrick Figueroa. is a 87 y.o. male with a past medical history of anemia, arthritis, hypertension, CHF, hyperlipidemia, approximately 1 month status post right knee replacement presents to the emergency department for generalized weakness.  According to the patient for the past month or so he has been feeling increased strength since the knee replacement however over the last several days he has begun feeling more weak.  States that weakness was significant tonight so he came to the emergency department for evaluation.  EMS states blood pressure around 95 systolic during their evaluation, 117 systolic upon arrival.  Patient denies any chest pain denies any cough congestion or fever.  Denies any recent illnesses.  Does states slight dysuria at times.  Physical Exam   Triage Vital Signs: ED Triage Vitals  Enc Vitals Group     BP      Pulse      Resp      Temp      Temp src      SpO2      Weight      Height      Head Circumference      Peak Flow      Pain Score      Pain Loc      Pain Edu?      Excl. in GC?     Most recent vital signs: There were no vitals filed for this visit.  General: Awake, no distress.  CV:  Good peripheral perfusion.  Regular rate and rhythm  Resp:  Normal effort.  Equal breath sounds bilaterally.  Abd:  No distention.  Soft, nontender.  No rebound or guarding. Other:  1+ lower extremity edema equal bilaterally.  No erythema or tenderness of the right knee.  No tenderness of the calfs.   ED Results / Procedures / Treatments   EKG  EKG viewed and interpreted by myself shows a normal sinus rhythm 82 bpm with a narrow QRS, normal axis, slight QTc prolongation otherwise normal intervals with nonspecific but no concerning ST changes.  MEDICATIONS ORDERED IN  ED: Medications  sodium chloride 0.9 % bolus 500 mL (has no administration in time range)     IMPRESSION / MDM / ASSESSMENT AND PLAN / ED COURSE  I reviewed the triage vital signs and the nursing notes.  Patient's presentation is most consistent with acute presentation with potential threat to life or bodily function.  Patient presents emergency department for generalized weakness.  Overall the patient appears well during my evaluation.  Reassuring physical exam.  Given the patient's complaint of weakness we will obtain a COVID/flu swab.  We will IV hydrate with 500 cc of normal saline.  Patient does have mild lower extremity edema 1+ equal bilaterally no calf tenderness or erythema.  Denies any chest pain or shortness of breath.  We will check labs including cardiac enzymes and EKG.  Will obtain a urine sample and continue to closely monitor.  Patient agreeable to plan.  Patient is lab work shows a reassuring urinalysis, normal CBC besides mild anemia unchanged from baseline.  Patient's chemistry shows mild renal insufficiency but again largely unchanged from baseline.  Patient's COVID test is positive likely explaining the patient's weakness.  Patient states he is feeling much better after some fluids.  Wife is here with the patient.  They feel comfortable going home.  Will place the patient on Paxlovid given his comorbidities.  Discussed COVID return precautions.  They are agreeable to this plan.  FINAL CLINICAL IMPRESSION(S) / ED DIAGNOSES   Weakness COVID-19   Note:  This document was prepared using Dragon voice recognition software and may include unintentional dictation errors.   Minna Antis, MD 04/04/23 [id number]

## 2023-04-04 LAB — URINALYSIS, COMPLETE (UACMP) WITH MICROSCOPIC
Bacteria, UA: NONE SEEN
Bilirubin Urine: NEGATIVE
Glucose, UA: NEGATIVE mg/dL
Hgb urine dipstick: NEGATIVE
Ketones, ur: NEGATIVE mg/dL
Leukocytes,Ua: NEGATIVE
Nitrite: NEGATIVE
Protein, ur: NEGATIVE mg/dL
Specific Gravity, Urine: 1.004 — ABNORMAL LOW (ref 1.005–1.030)
pH: 5 (ref 5.0–8.0)

## 2023-04-04 LAB — COMPREHENSIVE METABOLIC PANEL
ALT: 19 U/L (ref 0–44)
AST: 24 U/L (ref 15–41)
Albumin: 3.3 g/dL — ABNORMAL LOW (ref 3.5–5.0)
Alkaline Phosphatase: 63 U/L (ref 38–126)
Anion gap: 9 (ref 5–15)
BUN: 20 mg/dL (ref 8–23)
CO2: 25 mmol/L (ref 22–32)
Calcium: 8.1 mg/dL — ABNORMAL LOW (ref 8.9–10.3)
Chloride: 96 mmol/L — ABNORMAL LOW (ref 98–111)
Creatinine, Ser: 1.34 mg/dL — ABNORMAL HIGH (ref 0.61–1.24)
GFR, Estimated: 52 mL/min — ABNORMAL LOW (ref >60)
Glucose, Bld: 107 mg/dL — ABNORMAL HIGH (ref 70–99)
Potassium: 3.2 mmol/L — ABNORMAL LOW (ref 3.5–5.1)
Sodium: 130 mmol/L — ABNORMAL LOW (ref 135–145)
Total Bilirubin: 0.9 mg/dL (ref 0.3–1.2)
Total Protein: 6.1 g/dL — ABNORMAL LOW (ref 6.5–8.1)

## 2023-04-04 LAB — CBC
HCT: 28.5 % — ABNORMAL LOW (ref 39.0–52.0)
Hemoglobin: 9.2 g/dL — ABNORMAL LOW (ref 13.0–17.0)
MCH: 27.7 pg (ref 26.0–34.0)
MCHC: 32.3 g/dL (ref 30.0–36.0)
MCV: 85.8 fL (ref 80.0–100.0)
Platelets: 187 10*3/uL (ref 150–400)
RBC: 3.32 MIL/uL — ABNORMAL LOW (ref 4.22–5.81)
RDW: 14.1 % (ref 11.5–15.5)
WBC: 7.8 10*3/uL (ref 4.0–10.5)
nRBC: 0 % (ref 0.0–0.2)

## 2023-04-04 LAB — RESP PANEL BY RT-PCR (RSV, FLU A&B, COVID)  RVPGX2
Influenza A by PCR: NEGATIVE
Influenza B by PCR: NEGATIVE
Resp Syncytial Virus by PCR: NEGATIVE
SARS Coronavirus 2 by RT PCR: POSITIVE — AB

## 2023-04-04 LAB — TROPONIN I (HIGH SENSITIVITY): Troponin I (High Sensitivity): 8 ng/L (ref <18)

## 2023-04-04 MED ORDER — NIRMATRELVIR/RITONAVIR (PAXLOVID) TABLET (RENAL DOSING): 2.0000 | ORAL_TABLET | Freq: Two times a day (BID) | ORAL | 0 refills | Status: AC

## 2023-04-04 NOTE — ED Notes (Signed)
Pt assisted to in room toilet with walker. Pt ambulated steady with assistance from this RN and pt wife. Pt x1 void, x1 BM. Pt back in bed and denies current needs

## 2023-04-04 NOTE — Discharge Instructions (Addendum)
As we discussed please take your medication as prescribed.  Please obtain plenty of rest and stay well-hydrated.  Return to the emergency department for any significant or profound weakness, any trouble breathing, or any other symptom personally concerning to yourself.

## 2023-04-18 IMAGING — CT CT CHEST W/O CM
2 of 4 series · 15 of 36 positions shown, 18 images · non-contrast
Comparison: 08/24/2021

CLINICAL DATA: Follow-up cavitary lung mass

EXAM:
CT CHEST WITHOUT CONTRAST
TECHNIQUE: Multidetector CT imaging of the chest was performed following the
standard protocol without IV contrast.

[Series 2: chest 2.00 · axial · 0.73mm/px · z∈[-1164,-904]mm · 12 of 154 slices shown, 15 images]
[im 12/154  mediastinal]
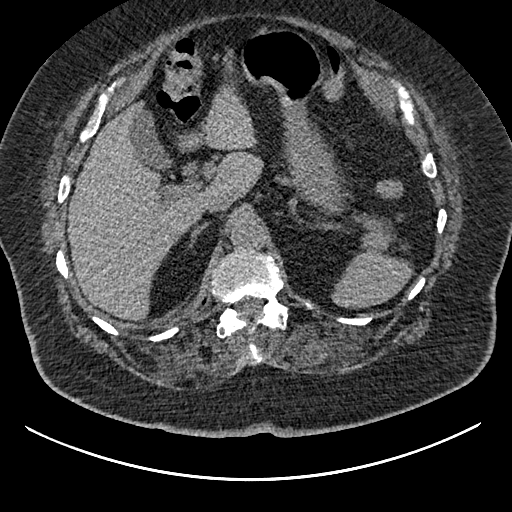
[im 12/154  lung]
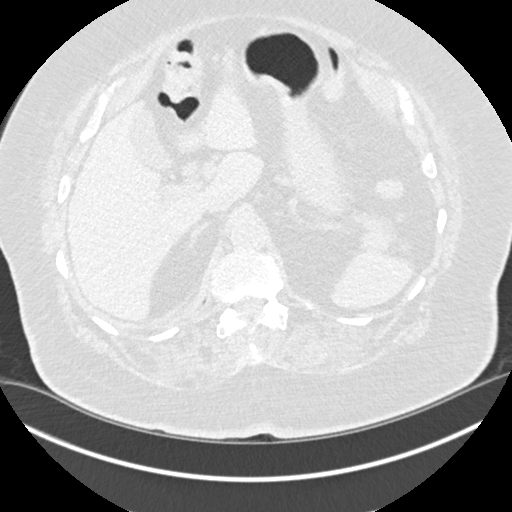
[im 24/154  lung]
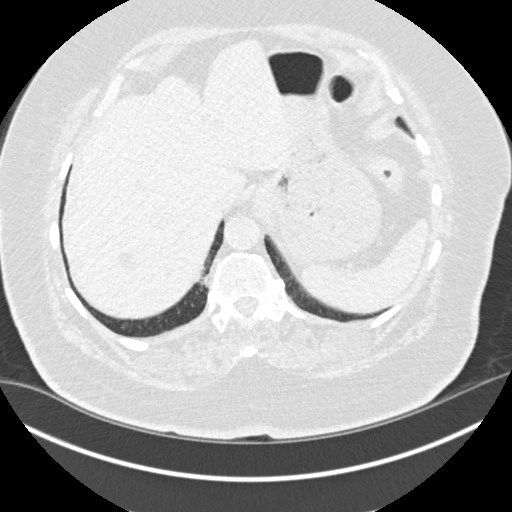
[im 36/154  lung]
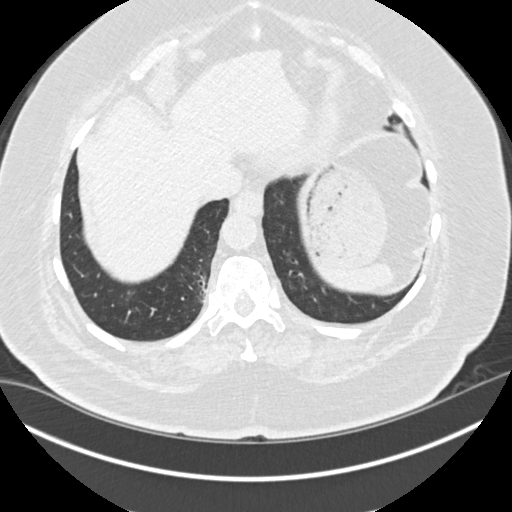
[im 48/154  lung]
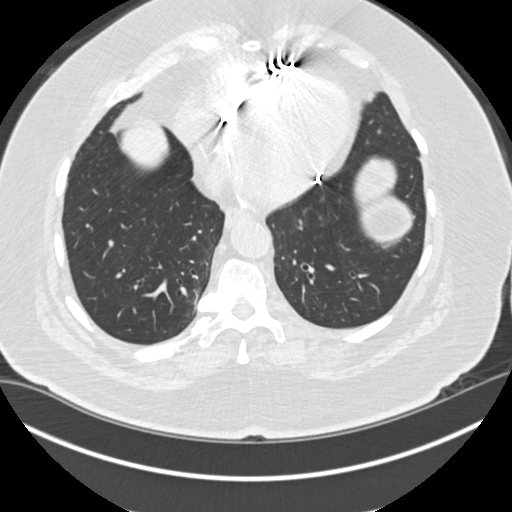
[im 59/154  mediastinal]
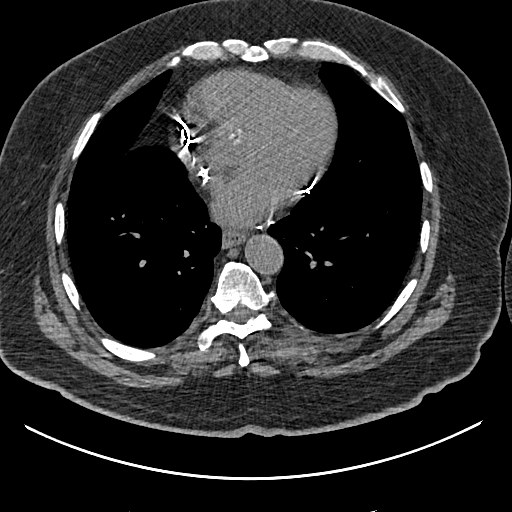
[im 59/154  lung]
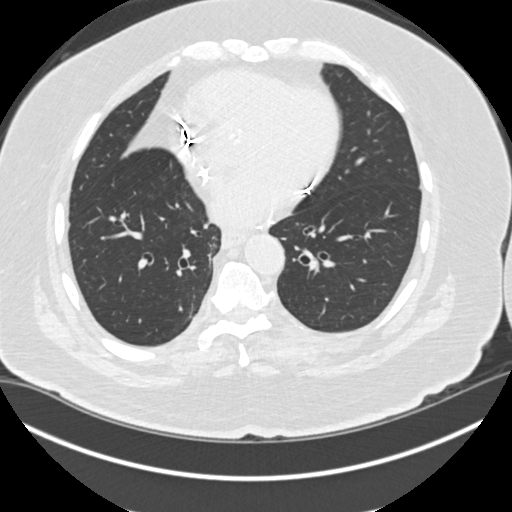
[im 71/154  lung]
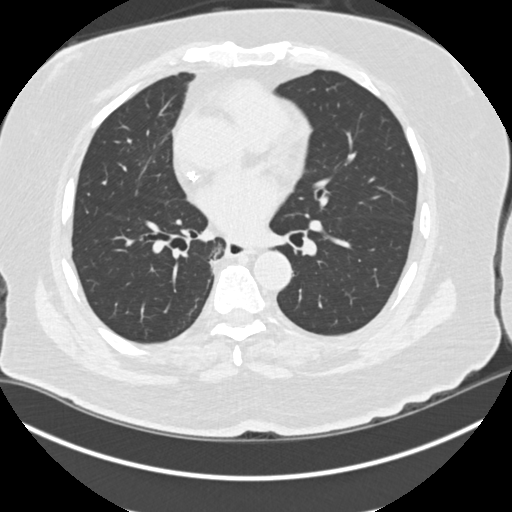
[im 83/154  lung]
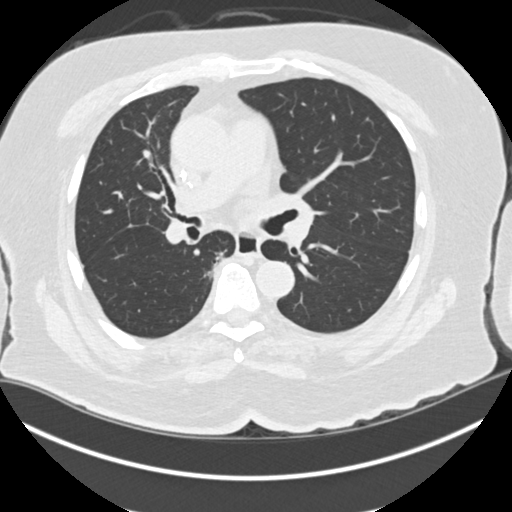
[im 95/154  lung]
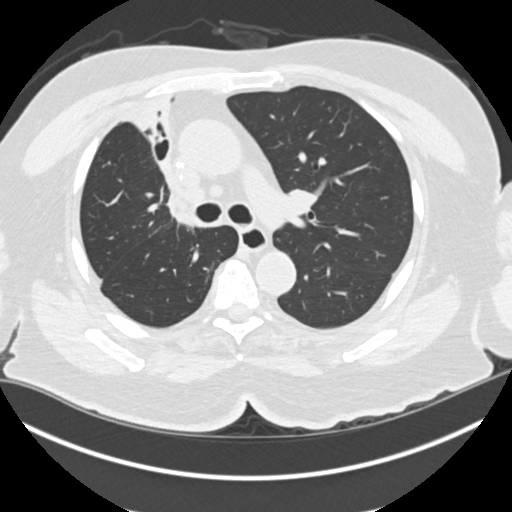
[im 106/154  mediastinal]
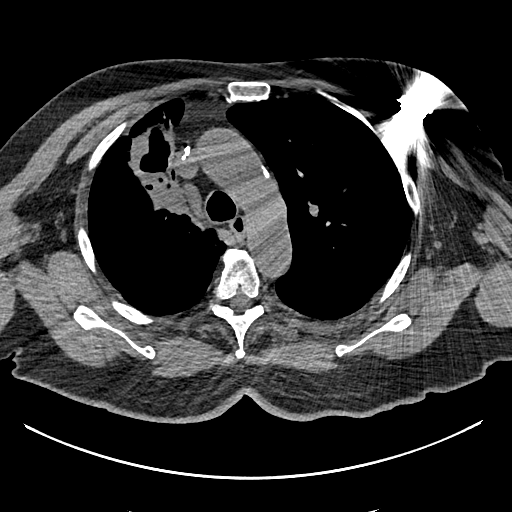
[im 106/154  lung]
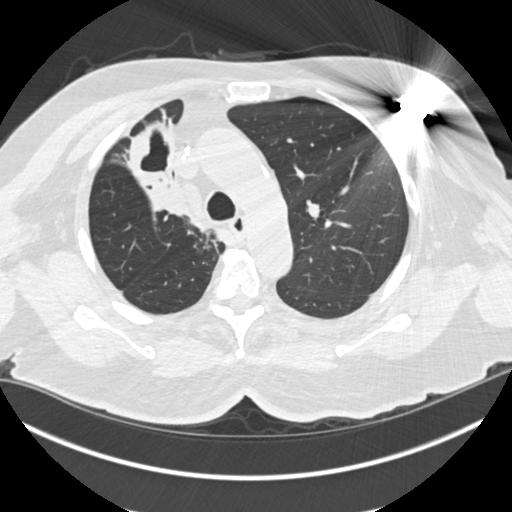
[im 118/154  lung]
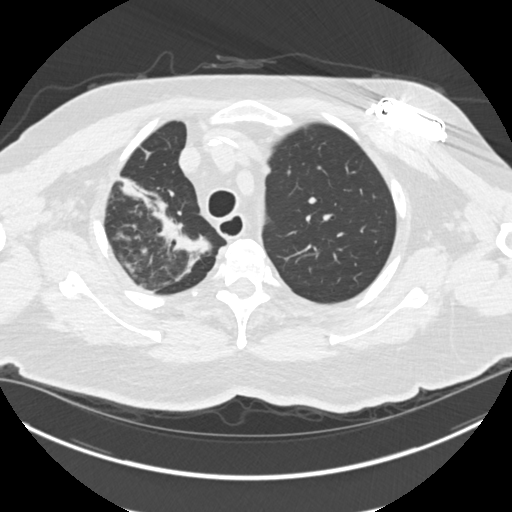
[im 130/154  lung]
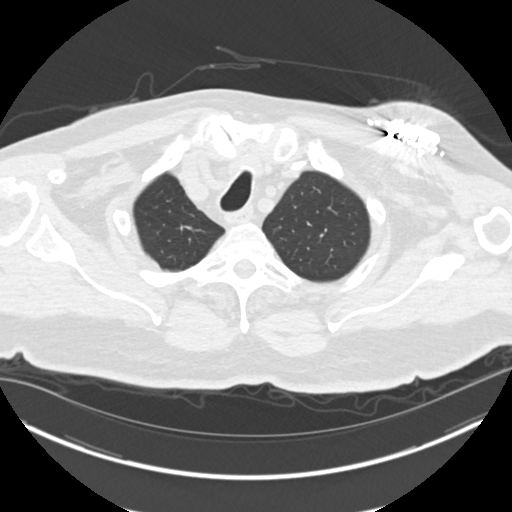
[im 142/154  lung]
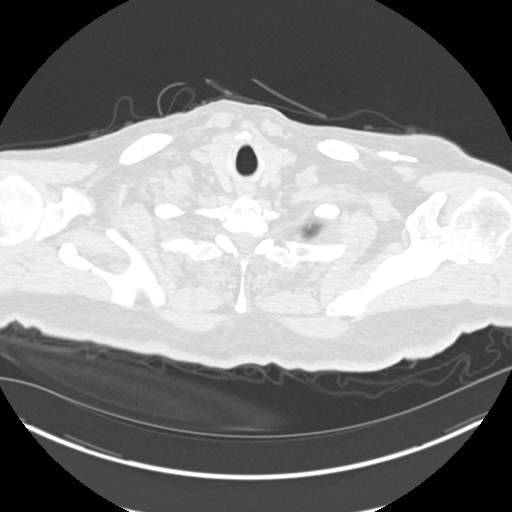

[Series 5: coronals chest 2.00 cor · coronal · 0.60mm/px · 3 of 164 slices shown]
[im 33/164  lung]
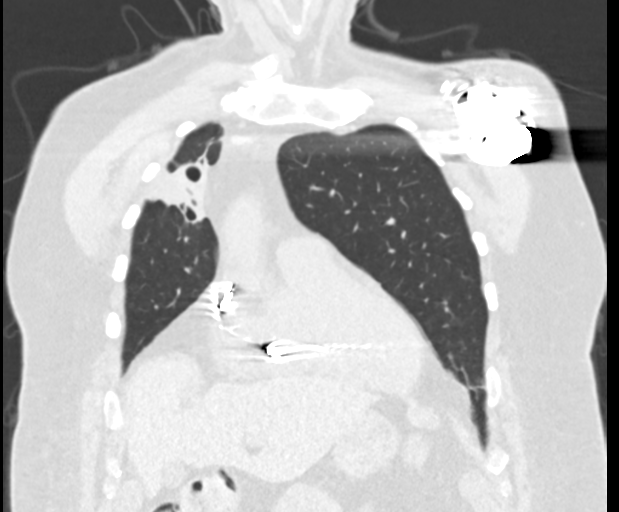
[im 66/164  lung]
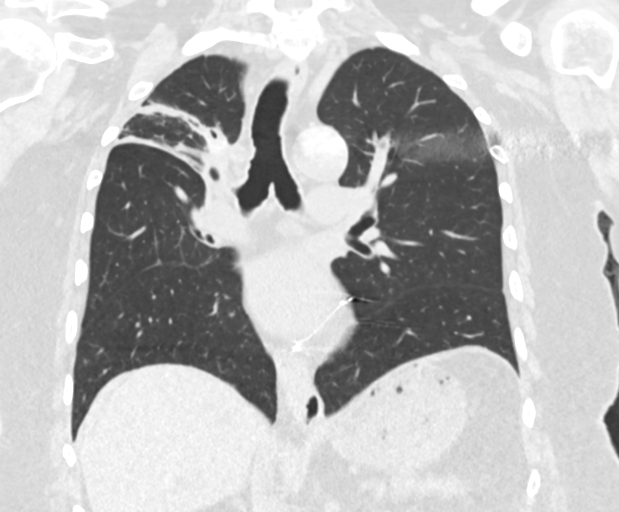
[im 98/164  lung]
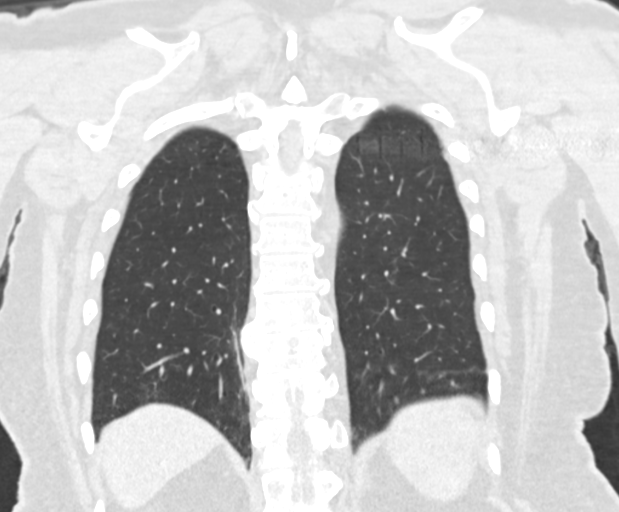

[15 of 36 positions shown; findings below may reference images not displayed]

FINDINGS: Cardiovascular: Left chest multi lead pacer defibrillator. Scattered
aortic atherosclerosis normal heart size. No pericardial effusion.

Mediastinum/Nodes: No enlarged mediastinal, hilar, or axillary lymph
nodes. Small hiatal hernia. Thyroid gland, trachea, and esophagus
demonstrate no significant findings.

Lungs/Pleura: Significant interval decrease in size of a
thick-walled, masslike cavitary lesion of the anterior right upper
lobe, now measuring approximately 4.8 x 3.4 cm, previously 8.1 x
cm when measured similarly (series 3, image 51). There is some
residual, although improved heterogeneous airspace disease and
consolidation, with progression of scarring, fibrosis, and volume
loss of the right upper lobe. Redemonstrated proximal occlusion or
effacement of the right upper lobe bronchus (series 3, image 57). No
pleural effusion or pneumothorax.

Upper Abdomen: No acute abnormality. Stable, benign cyst or
hemangioma of the posterior liver dome (series 2, image 127).

Musculoskeletal: No chest wall mass or suspicious bone lesions
identified.
IMPRESSION: 1. Significant interval decrease in size of a thick-walled, masslike
cavitary lesion of the anterior right upper lobe, now measuring
approximately 4.8 x 3.4 cm, previously 8.1 x 5.7 cm when measured
similarly.

2. There is some residual, although improved heterogeneous airspace
disease and consolidation, with progression of scarring, fibrosis,
and volume loss of the right upper lobe.

3. Findings are generally consistent with improving cavitary
infection.

4. Redemonstrated proximal occlusion or effacement of the right
upper lobe bronchus; as on prior examination, obstructing neoplasm
cannot be excluded. Correlate with bronchoscopic findings.

Aortic Atherosclerosis (CI6EA-UWW.W).

## 2023-07-25 IMAGING — CT CT CHEST W/O CM
2 of 4 series · 15 of 36 positions shown, 18 images · non-contrast
Comparison: 10/04/2021

CLINICAL DATA: Cavitary lung mass.  Follow-up.



[Series 2: chest 2.00 · axial · 0.62mm/px · z∈[-1088,-802]mm · 12 of 170 slices shown, 15 images]
[im 14/170  mediastinal]
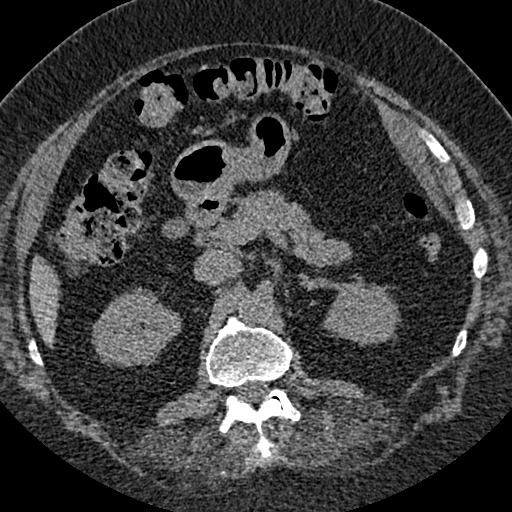
[im 14/170  lung]
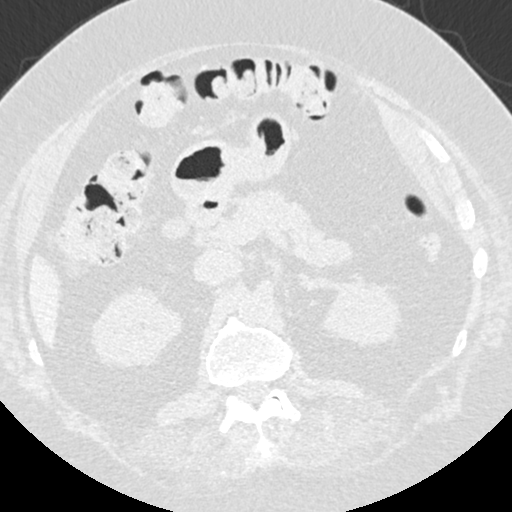
[im 27/170  lung]
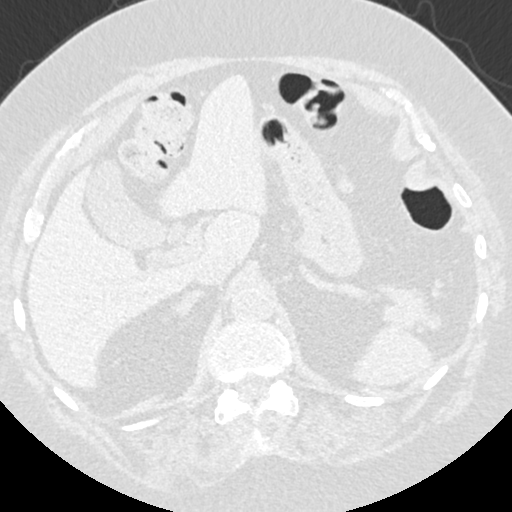
[im 40/170  lung]
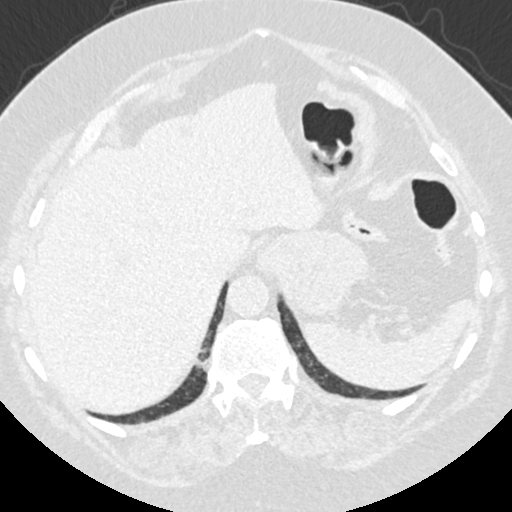
[im 53/170  lung]
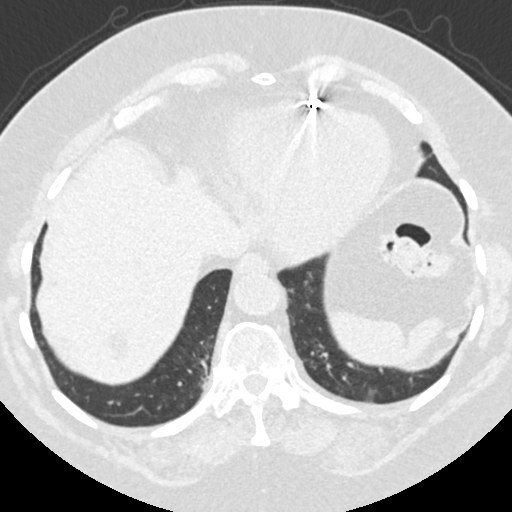
[im 66/170  mediastinal]
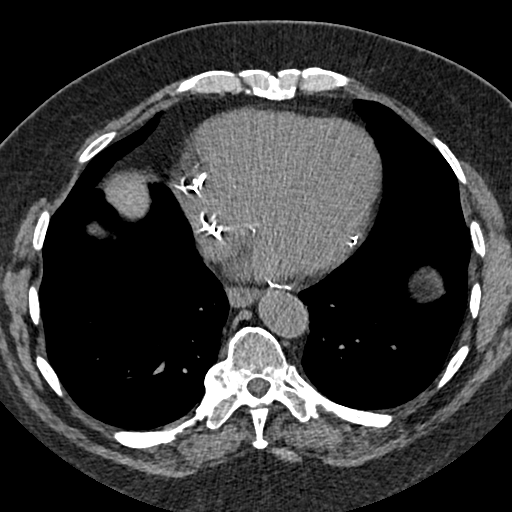
[im 66/170  lung]
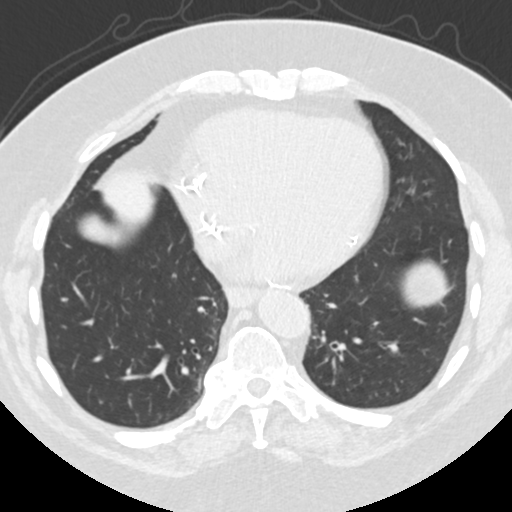
[im 79/170  lung]
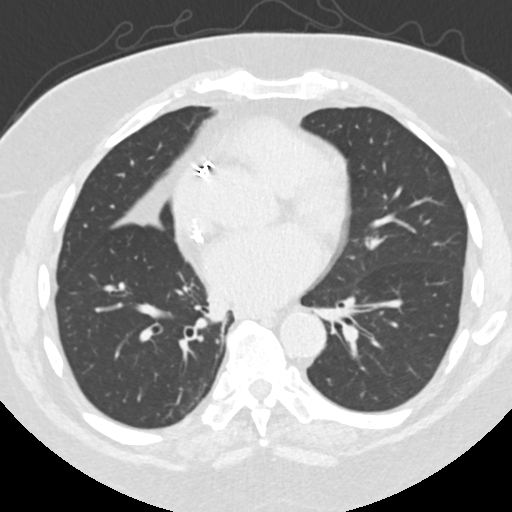
[im 92/170  lung]
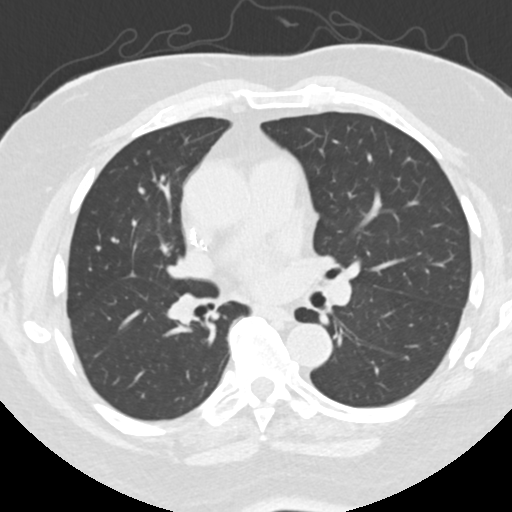
[im 105/170  lung]
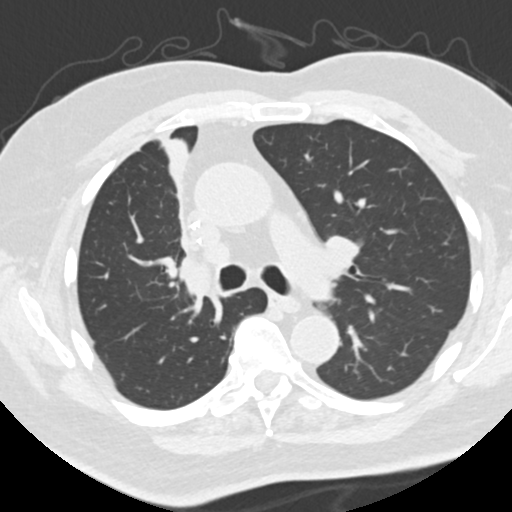
[im 118/170  mediastinal]
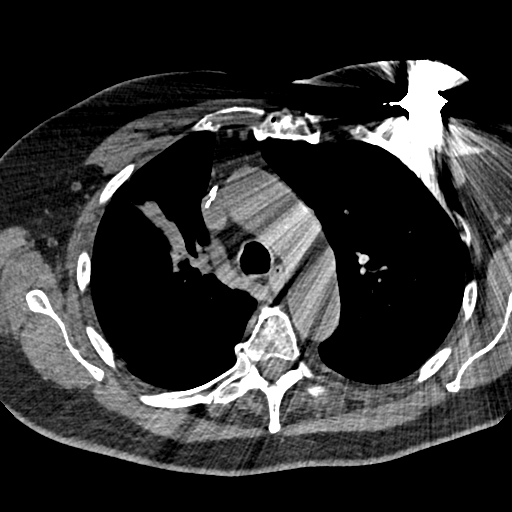
[im 118/170  lung]
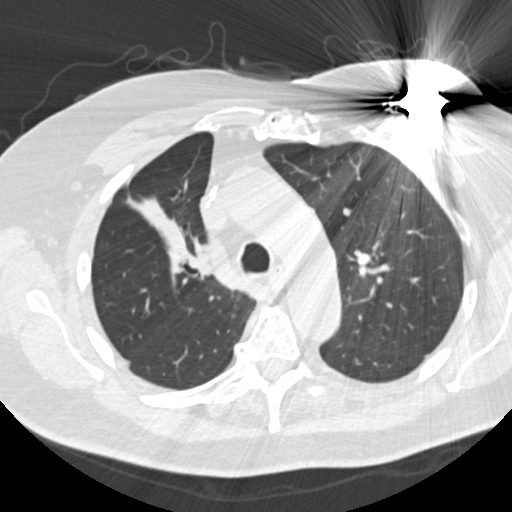
[im 131/170  lung]
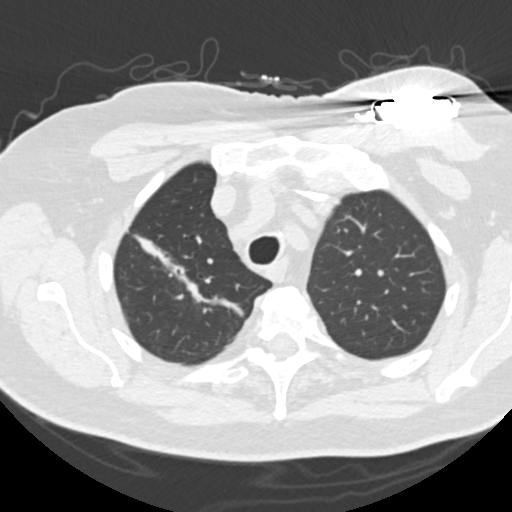
[im 144/170  lung]
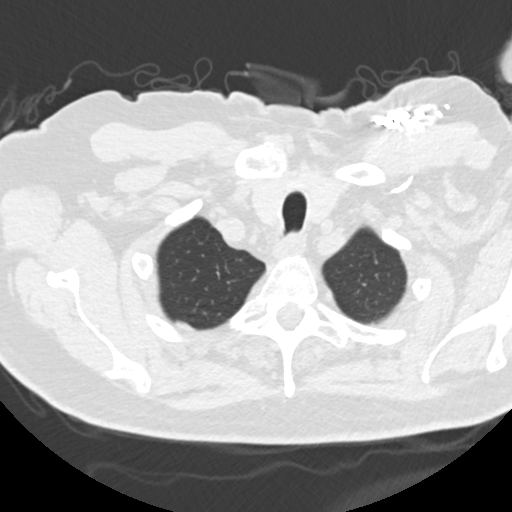
[im 157/170  lung]
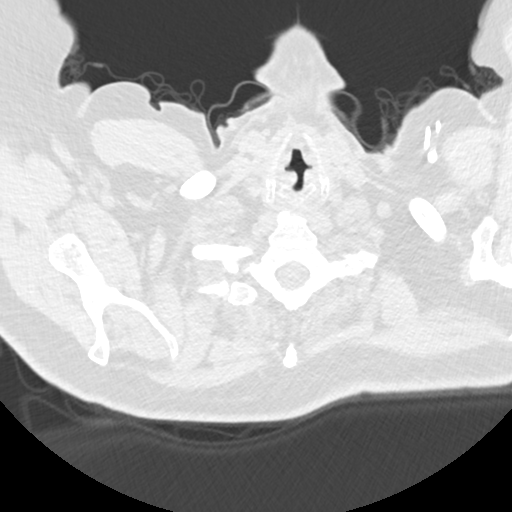

[Series 5: coronals chest 2.00 cor · coronal · 0.62mm/px · 3 of 157 slices shown]
[im 32/157  lung]
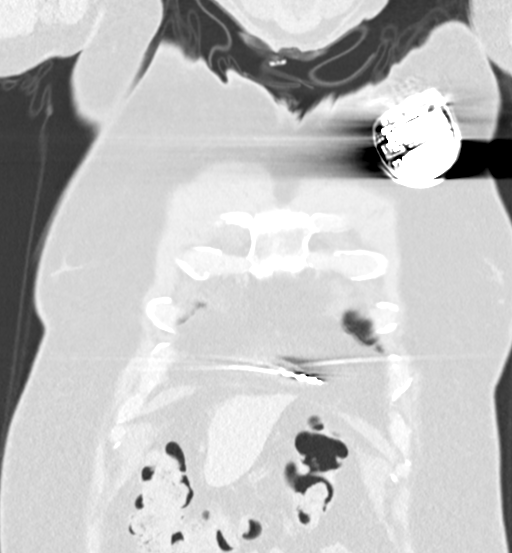
[im 63/157  lung]
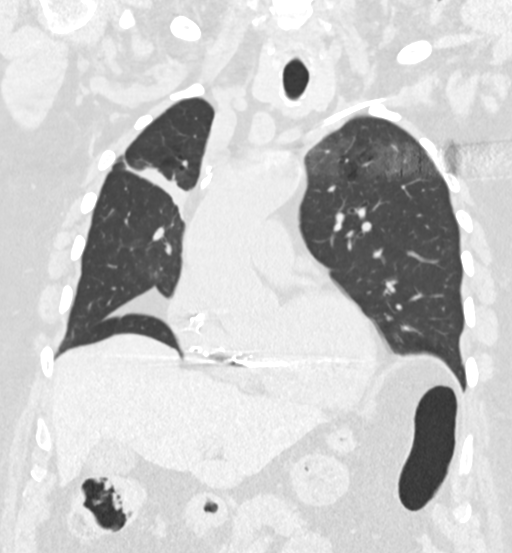
[im 94/157  lung]
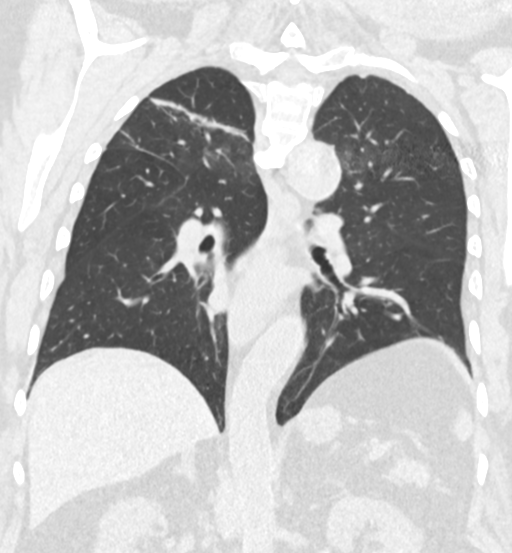

[15 of 36 positions shown; findings below may reference images not displayed]

FINDINGS: Cardiovascular: The heart size is normal. No substantial pericardial
effusion. Mild atherosclerotic calcification is noted in the wall of
the thoracic aorta.

Mediastinum/Nodes: No mediastinal lymphadenopathy. No evidence for
gross hilar lymphadenopathy although assessment is limited by the
lack of intravenous contrast on the current study. The esophagus has
normal imaging features. There is no axillary lymphadenopathy.

Lungs/Pleura: Continued further decrease in the cavitary lesion
noted previously in the anterior right upper lobe. There is no
persistent cavitary component on today's study with a bandlike area
of consolidative volume loss in the region today. The ill-defined
patchy and nodular airspace disease seen peripherally towards the
right lung apex has nearly resolved. No new suspicious pulmonary
nodule or mass. No suspicious nodule or mass in the left lung.

Upper Abdomen: Tiny scattered hypodensities in the liver are stable,
likely cyst.

Musculoskeletal: No worrisome lytic or sclerotic osseous
abnormality.
IMPRESSION: Continued further decrease in the cavitary lesion noted previously
in the anterior right upper lobe. There is no persistent cavitary
component on today's study with a residual bandlike area of
consolidative volume loss in the region today. The ill-defined
patchy and nodular airspace disease seen peripherally towards the
right lung apex has nearly resolved.

As on prior studies, the right upper lobe bronchus remains effaced.

Aortic Atherosclerosis ([id number]-RPX.X).

## 2024-05-08 ENCOUNTER — Ambulatory Visit: Admitting: Urology

## 2024-05-08 VITALS — BP 134/78 | HR 76 | Ht 68.0 in | Wt 215.8 lb

## 2024-05-08 DIAGNOSIS — R972 Elevated prostate specific antigen [PSA]: Secondary | ICD-10-CM | POA: Diagnosis not present

## 2024-05-08 NOTE — Progress Notes (Signed)
 Patrick Figueroa,acting as a scribe for Dustin Gimenez, MD.,have documented all relevant documentation on the behalf of Dustin Gimenez, MD,as directed by  Dustin Gimenez, MD while in the presence of Dustin Gimenez, MD.  05/08/24 1:09 PM   Patrick Figueroa. 1936-02-11 [id number]  Referring provider: Yehuda Helms, MD [address] [facility] [place],  [place] [address]  Chief Complaint  Patient presents with   Establish Care   Elevated PSA    HPI: 88 year old male with a personal history of CHF, hyperlipidemia, and anemia presents today for further evaluation of elevated PSA levels.   He has been undergoing annual PSA screenings by his primary care physician, Dr. Claudius Cumins, since 2019. Initially, his PSA was around 6 and remained in that range through 2022, when it rose to 7. It went back down to the 5-6 range in 2023 and rose to 8.6 last year. It then decreased to 6, rose again to 8.8 eight months ago, and was 11.87 four months later. Most recently, it increased to 15.78.   His urinalysis at the same time was negative. He does not have any recent cross-sectional imaging.   He denies any urinary symptoms and his urinalysis was negative. He is not on any BPH medications.   He is concerned about the elevated PSA levels and is seeking further evaluation to rule out prostate cancer.   PSA trend: 09/29/2020     5.09 05/26/2021     7.05 09/10/2021     7.13 12/28/2021     5.83 06/27/2022     5.34 01/16/2023     8.61 04/18/2023     6.67 08/24/2023     8.86 12/27/2023     11.87 04/30/2024     15.78  PMH: Past Medical History:  Diagnosis Date   AICD (automatic cardioverter/defibrillator) present 06/25/2018   a.) Medtronic Claria MRI Quad BIV ICD with MRI conditional leads   Anemia    Aortic atherosclerosis (HCC)    Arthritis    Asthma    Benign essential hypertension    Bronchiectasis with (acute) exacerbation (HCC)    Mounier-Kuhn   CHF (congestive heart  failure), NYHA class III, chronic, systolic (HCC)    a.) TTE 05/02/2016: EF 25%, mod LVE, sev glob HK, LAE, triv PR, mild MR/TR; b.) TTE 04/27/2018: EF 20%, mod LVE, sev glob HK, BAE, RVE, triv AR/PR, mild MR/TR; c.) TTE 01/10/2019: EF 40%, mild glob HK, triv AR/PR, mild MR/TR; d.) TTE 04/28/2021: EF 50% triv AR, mild MR/TR; e.) TTE 12/07/2022: EF >55%, triv MR, mild TR, G1DD   Complete heart block (HCC)    a.) s/p PPM placement 2009 for SSS/CHB; b.) upgraded to CRT-D 06/25/2018 (Medtronic Claria MRI Quad BIV ICD with MRI conditional leads)   COPD (chronic obstructive pulmonary disease) (HCC)    Elevated PSA    Environmental allergies    History of bilateral cataract extraction    History of cardiac pacemaker 2009   a.) placed in 2009; removed and upgraded to CRT-D device 06/25/2018   Hx of adenomatous colonic polyps    Mixed hyperlipidemia    Necrotizing pneumonia (HCC)    NICM (nonischemic cardiomyopathy) (HCC)    a.) TTE 05/02/2016: EF 25%; b.) TTE 04/27/2018: EF 20%; c.) CRT-D placed 06/25/2018: d.) TTE 01/10/2019: EF 40%; e.) TTE 04/28/2021: EF 50%; f.) TTE 12/07/2022: EF >55%   Osteoarthritis     Surgical History: Past Surgical History:  Procedure Laterality Date   CARDIAC DEFIBRILLATOR PLACEMENT N/A  06/25/2018   Procedure: INSERTION OR REPLACEMENT OF PERMANENT IMPLANTABLE DEFIBRILLATOR SYSTEM, WITH TRANSVENOUS LEAD(S), SINGLE OR DUAL CHAMBER; Location: [facility]   CATARACT EXTRACTION W/PHACO Right 12/09/2015   Procedure: CATARACT EXTRACTION PHACO AND INTRAOCULAR LENS PLACEMENT (IOC);  Surgeon: Annell Kidney, MD;  Location: [facility];  Service: Ophthalmology;  Laterality: Right;   CATARACT EXTRACTION W/PHACO Left 01/13/2016   Procedure: CATARACT EXTRACTION PHACO AND INTRAOCULAR LENS PLACEMENT (IOC);  Surgeon: Annell Kidney, MD;  Location: [facility];  Service: Ophthalmology;  Laterality: Left;   COLONOSCOPY N/A 2007   COLONOSCOPY N/A 2012   MEDIASTINOTOMY  N/A 06/25/2018   Procedure: MEDIASTINOTOMY WITH EXPLORATION, DRAINAGE, REMOVAL OF FOREIGN BODY, OR BIOPSY; TRANSTHORACIC APPROACH, INCLUDING EITHER TRANSTHORACIC OR MEDIAN STERNOTOMY, for open lead extraction; Location: [facility]   PACEMAKER INSERTION  09/29/2008   PACEMAKER REMOVAL N/A 06/25/2018   Procedure: REMOVAL OF SINGLE OR DUAL CHAMBER IMPLANTABLE DEFIBRILLATOR ELECTRODE(S); BY TRANSVENOUS EXTRACTION; Location: [facility]   TOTAL KNEE ARTHROPLASTY Right 03/02/2023   Procedure: TOTAL KNEE ARTHROPLASTY;  Surgeon: Venus Ginsberg, MD;  Location: [facility];  Service: Orthopedics;  Laterality: Right;   VIDEO BRONCHOSCOPY Bilateral 08/26/2021   Procedure: VIDEO BRONCHOSCOPY;  Surgeon: Erskin Hearing, MD;  Location: [facility];  Service: Thoracic;  Laterality: Bilateral;    Home Medications:  Allergies as of 05/08/2024   No Known Allergies      Medication List        Accurate as of May 08, 2024  1:09 PM. If you have any questions, ask your nurse or doctor.          acetaminophen  500 MG tablet Commonly known as: TYLENOL  Take 2 tablets (1,000 mg total) by mouth every 8 (eight) hours.   acetylcysteine 20 % nebulizer solution Commonly known as: MUCOMYST Take 4 mLs by nebulization 2 (two) times daily as needed.   albuterol  1.25 MG/3ML nebulizer solution Commonly known as: ACCUNEB  Inhale 1 ampule into the lungs every 6 (six) hours as needed.   aspirin  81 MG tablet Take 81 mg by mouth daily. AM   azelastine 0.05 % ophthalmic solution Commonly known as: OPTIVAR Place 1 drop into both eyes 2 (two) times daily.   azithromycin 250 MG tablet Commonly known as: ZITHROMAX Take 250 mg by mouth 3 (three) times a week. Monday, Wednesday, Friday   carvedilol  12.5 MG tablet Commonly known as: COREG  Take 12.5 mg by mouth 2 (two) times daily with a meal.   cetirizine  10 MG tablet Commonly known as: ZYRTEC  Take 10 mg by mouth daily as needed for allergies.   enoxaparin  40 MG/0.4ML  injection Commonly known as: LOVENOX  Inject 0.4 mLs (40 mg total) into the skin daily for 14 days.   Entresto  24-26 MG Generic drug: sacubitril -valsartan  Take 1 tablet by mouth 2 (two) times daily.   Ex-Lax Maximum Strength 25 MG Tabs Generic drug: Sennosides Take 1 tablet by mouth as needed.   fluticasone  50 MCG/ACT nasal spray Commonly known as: FLONASE  Place 2 sprays into both nostrils daily.   guaiFENesin  600 MG 12 hr tablet Commonly known as: MUCINEX  Take 600 mg by mouth 2 (two) times daily as needed for to loosen phlegm.   latanoprost  0.005 % ophthalmic solution Commonly known as: XALATAN  Place 1 drop into the right eye at bedtime.   levocetirizine 5 MG tablet Commonly known as: XYZAL Take 1 tablet by mouth every evening.   montelukast  10 MG tablet Commonly known as: SINGULAIR  Take 10 mg by mouth every evening.   ondansetron  4 MG tablet  Commonly known as: ZOFRAN  Take 1 tablet (4 mg total) by mouth every 6 (six) hours as needed for nausea.   senna-docusate 8.6-50 MG tablet Commonly known as: Senokot-S Take 1 tablet by mouth daily.   torsemide 20 MG tablet Commonly known as: DEMADEX Take 1 tablet by mouth 2 (two) times daily as needed.   traMADol  50 MG tablet Commonly known as: ULTRAM  Take 1 tablet (50 mg total) by mouth every 6 (six) hours as needed for moderate pain.        Family History: Family History  Problem Relation Age of Onset   Asthma Father    Heart attack Sister    Asthma Brother    Rheum arthritis Other     Social History:  reports that he has never smoked. He has never used smokeless tobacco. He reports that he does not drink alcohol and does not use drugs.   Physical Exam: BP 134/78   Pulse 76   Ht 5\' 8"  (1.727 m)   Wt 215 lb 12.8 oz (97.9 kg)   BMI 32.81 kg/m   Constitutional:  Alert and oriented, No acute distress. HEENT:  AT, moist mucus membranes.  Trachea midline, no masses. GU: Prostate feels enlarged but no nodules  detected. Neurologic: Grossly intact, no focal deficits, moving all 4 extremities. Psychiatric: Normal mood and affect.   Assessment & Plan:    1. Elevated PSA - We reviewed the implications of an elevated PSA and the uncertainty surrounding it. In general, a man's PSA increases with age and is produced by both normal and cancerous prostate tissue. Differential for elevated PSA is BPH, prostate cancer, infection, recent intercourse/ejaculation, prostate infarction, recent urethroscopic manipulation (foley placement/cystoscopy) and prostatitis. Management of an elevated PSA can include observation or prostate biopsy and wediscussed this in detail. We discussed that indications for prostate biopsy are defined by age and race specific PSA cutoffs as well as a PSA velocity of 0.75/year. - His PSA has shown significant fluctuation over the years, with a recent increase to 15.78.  - Differential diagnoses include BPH, prostatitis, and prostate cancer.  - Given the absence of urinary symptoms and the negative urinalysis, infection is less likely.  - The DRE suggests prostatomegaly without palpable nodules, reducing the immediate concern for aggressive prostate cancer.  - The plan is to monitor the PSA level with a follow-up test in three months.  - If the PSA continues to rise, consider imaging such as an MRI to further evaluate the prostate.  - A biopsy is not currently recommended due to the risks and the likelihood of conservative management even if cancer is present. - We discussed the concept of watchful waiting especially at his age and relevance of PSA doubling time which is relatively low.  He is absolutely agreeable to this plan.  2. BPH - The DRE indicates an enlarged prostate consistent with BPH.  - He reports frequent urination but no difficulty or pain, suggesting that symptoms are manageable.  - Continue monitoring symptoms and PSA levels.  - No immediate intervention is required unless  symptoms worsen or PSA levels continue to rise.  Return in about 3 months (around 08/08/2024) for PA visit with repeat PSA.  I have reviewed the above documentation for accuracy and completeness, and I agree with the above.   Dustin Gimenez, MD   [facility] [address], [address] [place], [place] [address] [phone]

## 2024-08-05 ENCOUNTER — Other Ambulatory Visit: Payer: Self-pay

## 2024-08-05 DIAGNOSIS — R972 Elevated prostate specific antigen [PSA]: Secondary | ICD-10-CM

## 2024-08-07 ENCOUNTER — Other Ambulatory Visit

## 2024-08-07 DIAGNOSIS — R972 Elevated prostate specific antigen [PSA]: Secondary | ICD-10-CM

## 2024-08-08 LAB — PSA: Prostate Specific Ag, Serum: 15.4 ng/mL — ABNORMAL HIGH (ref 0.0–4.0)

## 2024-08-13 ENCOUNTER — Ambulatory Visit: Admitting: Physician Assistant

## 2024-08-13 VITALS — BP 147/87 | HR 80 | Ht 66.0 in | Wt 219.0 lb

## 2024-08-13 DIAGNOSIS — R972 Elevated prostate specific antigen [PSA]: Secondary | ICD-10-CM | POA: Diagnosis not present

## 2024-08-13 NOTE — Progress Notes (Signed)
 08/13/2024 2:36 PM   Patrick Figueroa. 05/27/1936 [id number]  CC: Chief Complaint  Patient presents with   Follow-up   Elevated PSA   HPI: Patrick Figueroa. is a 88 y.o. male with PMH CHF, BPH, and elevated PSA who presents today for follow-up.  He is accompanied today by his wife.  Today he reports he feels well with no acute concerns.  He has no bothersome voiding symptoms and is at his baseline in terms of energy level and mobility.  He has an AICD in place.  PSA trend: 09/29/2020     5.09 05/26/2021     7.05 09/10/2021     7.13 12/28/2021     5.83 06/27/2022     5.34 01/16/2023     8.61 04/18/2023     6.67 08/24/2023     8.86 12/27/2023     11.87 04/30/2024     15.78 08/07/2024 15.4  PMH: Past Medical History:  Diagnosis Date   AICD (automatic cardioverter/defibrillator) present 06/25/2018   a.) Medtronic Claria MRI Quad BIV ICD with MRI conditional leads   Anemia    Aortic atherosclerosis (HCC)    Arthritis    Asthma    Benign essential hypertension    Bronchiectasis with (acute) exacerbation (HCC)    Mounier-Kuhn   CHF (congestive heart failure), NYHA class III, chronic, systolic (HCC)    a.) TTE 05/02/2016: EF 25%, mod LVE, sev glob HK, LAE, triv PR, mild MR/TR; b.) TTE 04/27/2018: EF 20%, mod LVE, sev glob HK, BAE, RVE, triv AR/PR, mild MR/TR; c.) TTE 01/10/2019: EF 40%, mild glob HK, triv AR/PR, mild MR/TR; d.) TTE 04/28/2021: EF 50% triv AR, mild MR/TR; e.) TTE 12/07/2022: EF >55%, triv MR, mild TR, G1DD   Complete heart block (HCC)    a.) s/p PPM placement 2009 for SSS/CHB; b.) upgraded to CRT-D 06/25/2018 (Medtronic Claria MRI Quad BIV ICD with MRI conditional leads)   COPD (chronic obstructive pulmonary disease) (HCC)    Elevated PSA    Environmental allergies    History of bilateral cataract extraction    History of cardiac pacemaker 2009   a.) placed in 2009; removed and upgraded to CRT-D device 06/25/2018   Hx of adenomatous colonic polyps     Mixed hyperlipidemia    Necrotizing pneumonia (HCC)    NICM (nonischemic cardiomyopathy) (HCC)    a.) TTE 05/02/2016: EF 25%; b.) TTE 04/27/2018: EF 20%; c.) CRT-D placed 06/25/2018: d.) TTE 01/10/2019: EF 40%; e.) TTE 04/28/2021: EF 50%; f.) TTE 12/07/2022: EF >55%   Osteoarthritis     Surgical History: Past Surgical History:  Procedure Laterality Date   CARDIAC DEFIBRILLATOR PLACEMENT N/A 06/25/2018   Procedure: INSERTION OR REPLACEMENT OF PERMANENT IMPLANTABLE DEFIBRILLATOR SYSTEM, WITH TRANSVENOUS LEAD(S), SINGLE OR DUAL CHAMBER; Location: [facility]   CATARACT EXTRACTION W/PHACO Right 12/09/2015   Procedure: CATARACT EXTRACTION PHACO AND INTRAOCULAR LENS PLACEMENT (IOC);  Surgeon: Dene Etienne, MD;  Location: [facility];  Service: Ophthalmology;  Laterality: Right;   CATARACT EXTRACTION W/PHACO Left 01/13/2016   Procedure: CATARACT EXTRACTION PHACO AND INTRAOCULAR LENS PLACEMENT (IOC);  Surgeon: Dene Etienne, MD;  Location: [facility];  Service: Ophthalmology;  Laterality: Left;   COLONOSCOPY N/A 2007   COLONOSCOPY N/A 2012   MEDIASTINOTOMY N/A 06/25/2018   Procedure: MEDIASTINOTOMY WITH EXPLORATION, DRAINAGE, REMOVAL OF FOREIGN BODY, OR BIOPSY; TRANSTHORACIC APPROACH, INCLUDING EITHER TRANSTHORACIC OR MEDIAN STERNOTOMY, for open lead extraction; Location: [facility]   PACEMAKER INSERTION  09/29/2008   PACEMAKER REMOVAL N/A 06/25/2018  Procedure: REMOVAL OF SINGLE OR DUAL CHAMBER IMPLANTABLE DEFIBRILLATOR ELECTRODE(S); BY TRANSVENOUS EXTRACTION; Location: [facility]   TOTAL KNEE ARTHROPLASTY Right 03/02/2023   Procedure: TOTAL KNEE ARTHROPLASTY;  Surgeon: Lorelle Hussar, MD;  Location: [facility];  Service: Orthopedics;  Laterality: Right;   VIDEO BRONCHOSCOPY Bilateral 08/26/2021   Procedure: VIDEO BRONCHOSCOPY;  Surgeon: Parris Manna, MD;  Location: [facility];  Service: Thoracic;  Laterality: Bilateral;    Home Medications:  Allergies as of 08/13/2024   No Known  Allergies      Medication List        Accurate as of August 13, 2024  2:36 PM. If you have any questions, ask your nurse or doctor.          acetaminophen  500 MG tablet Commonly known as: TYLENOL  Take 2 tablets (1,000 mg total) by mouth every 8 (eight) hours.   acetylcysteine 20 % nebulizer solution Commonly known as: MUCOMYST Take 4 mLs by nebulization 2 (two) times daily as needed.   albuterol  1.25 MG/3ML nebulizer solution Commonly known as: ACCUNEB  Inhale 1 ampule into the lungs every 6 (six) hours as needed.   aspirin  81 MG tablet Take 81 mg by mouth daily. AM   azelastine 0.05 % ophthalmic solution Commonly known as: OPTIVAR Place 1 drop into both eyes 2 (two) times daily.   azithromycin 250 MG tablet Commonly known as: ZITHROMAX Take 250 mg by mouth 3 (three) times a week. Monday, Wednesday, Friday   carvedilol  12.5 MG tablet Commonly known as: COREG  Take 12.5 mg by mouth 2 (two) times daily with a meal.   cetirizine  10 MG tablet Commonly known as: ZYRTEC  Take 10 mg by mouth daily as needed for allergies.   enoxaparin  40 MG/0.4ML injection Commonly known as: LOVENOX  Inject 0.4 mLs (40 mg total) into the skin daily for 14 days.   Entresto  24-26 MG Generic drug: sacubitril -valsartan  Take 1 tablet by mouth 2 (two) times daily.   Ex-Lax Maximum Strength 25 MG Tabs Generic drug: Sennosides Take 1 tablet by mouth as needed.   fluticasone  50 MCG/ACT nasal spray Commonly known as: FLONASE  Place 2 sprays into both nostrils daily.   guaiFENesin  600 MG 12 hr tablet Commonly known as: MUCINEX  Take 600 mg by mouth 2 (two) times daily as needed for to loosen phlegm.   latanoprost  0.005 % ophthalmic solution Commonly known as: XALATAN  Place 1 drop into the right eye at bedtime.   levocetirizine 5 MG tablet Commonly known as: XYZAL Take 1 tablet by mouth every evening.   montelukast  10 MG tablet Commonly known as: SINGULAIR  Take 10 mg by mouth every  evening.   ondansetron  4 MG tablet Commonly known as: ZOFRAN  Take 1 tablet (4 mg total) by mouth every 6 (six) hours as needed for nausea.   senna-docusate 8.6-50 MG tablet Commonly known as: Senokot-S Take 1 tablet by mouth daily.   torsemide 20 MG tablet Commonly known as: DEMADEX Take 1 tablet by mouth 2 (two) times daily as needed.   traMADol  50 MG tablet Commonly known as: ULTRAM  Take 1 tablet (50 mg total) by mouth every 6 (six) hours as needed for moderate pain.        Allergies:  No Known Allergies  Family History: Family History  Problem Relation Age of Onset   Asthma Father    Heart attack Sister    Asthma Brother    Rheum arthritis Other     Social History:   reports that he has never smoked. He has never  used smokeless tobacco. He reports that he does not drink alcohol and does not use drugs.  Physical Exam: BP (!) 147/87 (BP Location: Left Arm, Patient Position: Sitting, Cuff Size: Normal)   Pulse 80   Ht 5' 6 (1.676 m)   Wt 219 lb (99.3 kg)   SpO2 97%   BMI 35.35 kg/m   Constitutional:  Alert and oriented, no acute distress, nontoxic appearing HEENT: [place], AT Cardiovascular: No clubbing, cyanosis, or edema Respiratory: Normal respiratory effort, no increased work of breathing Skin: No rashes, bruises or suspicious lesions Neurologic: Grossly intact, no focal deficits, moving all 4 extremities Psychiatric: Normal mood and affect  Laboratory Data: Results for orders placed or performed in visit on 08/07/24  PSA   Collection Time: 08/07/24  9:42 AM  Result Value Ref Range   Prostate Specific Ag, Serum 15.4 (H) 0.0 - 4.0 ng/mL   Assessment & Plan:   1. Elevated PSA (Primary) PSA stable.  No bothersome voiding symptoms.  We had a lengthy conversation about options including prostate MRI versus continued surveillance.  He prefers to defer decision making to me, and I was honest with him that we should make this decision together based on his  preferences and risk tolerance.  Ultimately we agreed to continue with surveillance.  If his PSA rises in 3 months, we will pursue MRI.  Will need to get cardiac clearance for MRI compatibility from his pacemaker/defibrillator. - PSA; Future   Return in about 3 months (around 11/13/2024) for Lab visit for PSA.  Lucie Hones, PA-C  [facility] [address], [address] [place], [place] [address] [phone]

## 2024-11-13 ENCOUNTER — Other Ambulatory Visit

## 2024-11-13 DIAGNOSIS — R972 Elevated prostate specific antigen [PSA]: Secondary | ICD-10-CM

## 2024-11-14 LAB — PSA: Prostate Specific Ag, Serum: 18 ng/mL — ABNORMAL HIGH (ref 0.0–4.0)

## 2024-11-19 ENCOUNTER — Ambulatory Visit: Payer: Self-pay | Admitting: Physician Assistant

## 2024-11-19 DIAGNOSIS — R972 Elevated prostate specific antigen [PSA]: Secondary | ICD-10-CM

## 2024-11-20 NOTE — Telephone Encounter (Signed)
 Pts wife LM on Triage line. States someone called and LM regarding results.   Relayed  SV message to Pt.  Advised centralized scheduling will be in touch to schedule MRI of his prostate.  Offered to give pt the number to call and get scheduled but he states he will wait on a phone call.   Advised pt to call back if he doesn't get scheduled in the next 2 weeks. Pt voiced understanding.

## 2024-11-25 ENCOUNTER — Telehealth: Payer: Self-pay | Admitting: Physician Assistant

## 2024-11-25 NOTE — Telephone Encounter (Signed)
 Patient's wife called stating that she had tried to reach out to Centralized Scheduling to schedule MRI appointment for patient. However, she was told that they have not yet received referral or order to get this done. Please advise.

## 2024-11-26 ENCOUNTER — Other Ambulatory Visit: Payer: Self-pay

## 2024-11-26 ENCOUNTER — Other Ambulatory Visit: Payer: Self-pay | Admitting: Physician Assistant

## 2024-11-26 DIAGNOSIS — R972 Elevated prostate specific antigen [PSA]: Secondary | ICD-10-CM

## 2024-11-27 ENCOUNTER — Other Ambulatory Visit (HOSPITAL_COMMUNITY)

## 2024-12-26 NOTE — CV Procedure (Signed)
" °  Device system confirmed to be MRI conditional, with implant date > 6 weeks ago, and no evidence of abandoned or epicardial leads in review of most recent CXR  Device last cleared by EP Provider: Prentice Passey 12/26/2024  Clearance is good through for 1 year as long as parameters remain stable at time of check. If pt undergoes a cardiac device procedure during that time, they should be re-cleared.   Tachy-therapies to be programmed off if applicable with device back to pre-MRI settings after completion of exam.  [employer] - Programming recommendation received through [employer] App/Tablet  Rocky Catalan, RT  12/26/2024 2:27 PM     "

## 2025-01-02 ENCOUNTER — Ambulatory Visit (HOSPITAL_COMMUNITY)
Admission: RE | Admit: 2025-01-02 | Discharge: 2025-01-02 | Disposition: A | Source: Ambulatory Visit | Attending: Physician Assistant

## 2025-01-02 ENCOUNTER — Ambulatory Visit (HOSPITAL_COMMUNITY)
Admission: RE | Admit: 2025-01-02 | Discharge: 2025-01-02 | Disposition: A | Discharge status: Home / Self Care | Source: Ambulatory Visit | Attending: Physician Assistant | Admitting: Physician Assistant

## 2025-01-02 DIAGNOSIS — R972 Elevated prostate specific antigen [PSA]: Secondary | ICD-10-CM

## 2025-01-02 MED ORDER — GADOBUTROL 1 MMOL/ML IV SOLN
9.0000 mL | Freq: Once | INTRAVENOUS | Status: AC | PRN
  Administered 2025-01-02: 9 mL via INTRAVENOUS

## 2025-01-09 ENCOUNTER — Telehealth: Payer: Self-pay | Admitting: Physician Assistant

## 2025-01-09 DIAGNOSIS — R972 Elevated prostate specific antigen [PSA]: Secondary | ICD-10-CM

## 2025-01-09 NOTE — Telephone Encounter (Signed)
 Patient's wife [facility] DPR) called to ask about patient's MRI results.

## 2025-01-11 ENCOUNTER — Ambulatory Visit: Payer: Self-pay | Admitting: Urology

## 2025-01-11 NOTE — Telephone Encounter (Signed)
 I spoke Mr. Patrick Figueroa  about his prostate MRI results and he will have his PSA repeated in 3 months.

## 2025-01-14 NOTE — Telephone Encounter (Signed)
 Spoke with Patient and appointment offered for lab visit 04/27 at 10 am. Patient accepted and verbalized understanding.

## 2025-01-20 NOTE — Addendum Note (Signed)
 Encounter addended by: Thayne Consuelo DEL on: 01/20/2025 11:13 AM  Actions taken: Imaging Exam ended, Charge Capture section accepted

## 2025-04-14 ENCOUNTER — Other Ambulatory Visit
